# Patient Record
Sex: Male | Born: 1985 | Race: White | Hispanic: No | Marital: Single | Smoking: Never smoker
Health system: Southern US, Community
[De-identification: ages and names within clinical notes are randomized; demographics above are authoritative.]

## PROBLEM LIST (undated history)

## (undated) DIAGNOSIS — T7840XA Allergy, unspecified, initial encounter: Secondary | ICD-10-CM

## (undated) HISTORY — DX: Allergy, unspecified, initial encounter: T78.40XA

---

## 2008-10-25 ENCOUNTER — Emergency Department: Payer: Self-pay | Admitting: Emergency Medicine

## 2010-02-07 ENCOUNTER — Emergency Department: Payer: Self-pay | Admitting: Emergency Medicine

## 2010-04-11 IMAGING — CR DG CHEST 1V PORT
1 series · 1 of 1 positions shown · non-contrast
Comparison: none

REASON FOR EXAM: SHORTNESS OF BREATH
COMMENTS:

PROCEDURE:     DXR - DXR PORTABLE CHEST SINGLE VIEW  - October 25, 2008  [DATE]
RESULT:     The lungs are clear. The cardiac silhouette and visualized bony
skeleton are unremarkable.

[view not recorded]
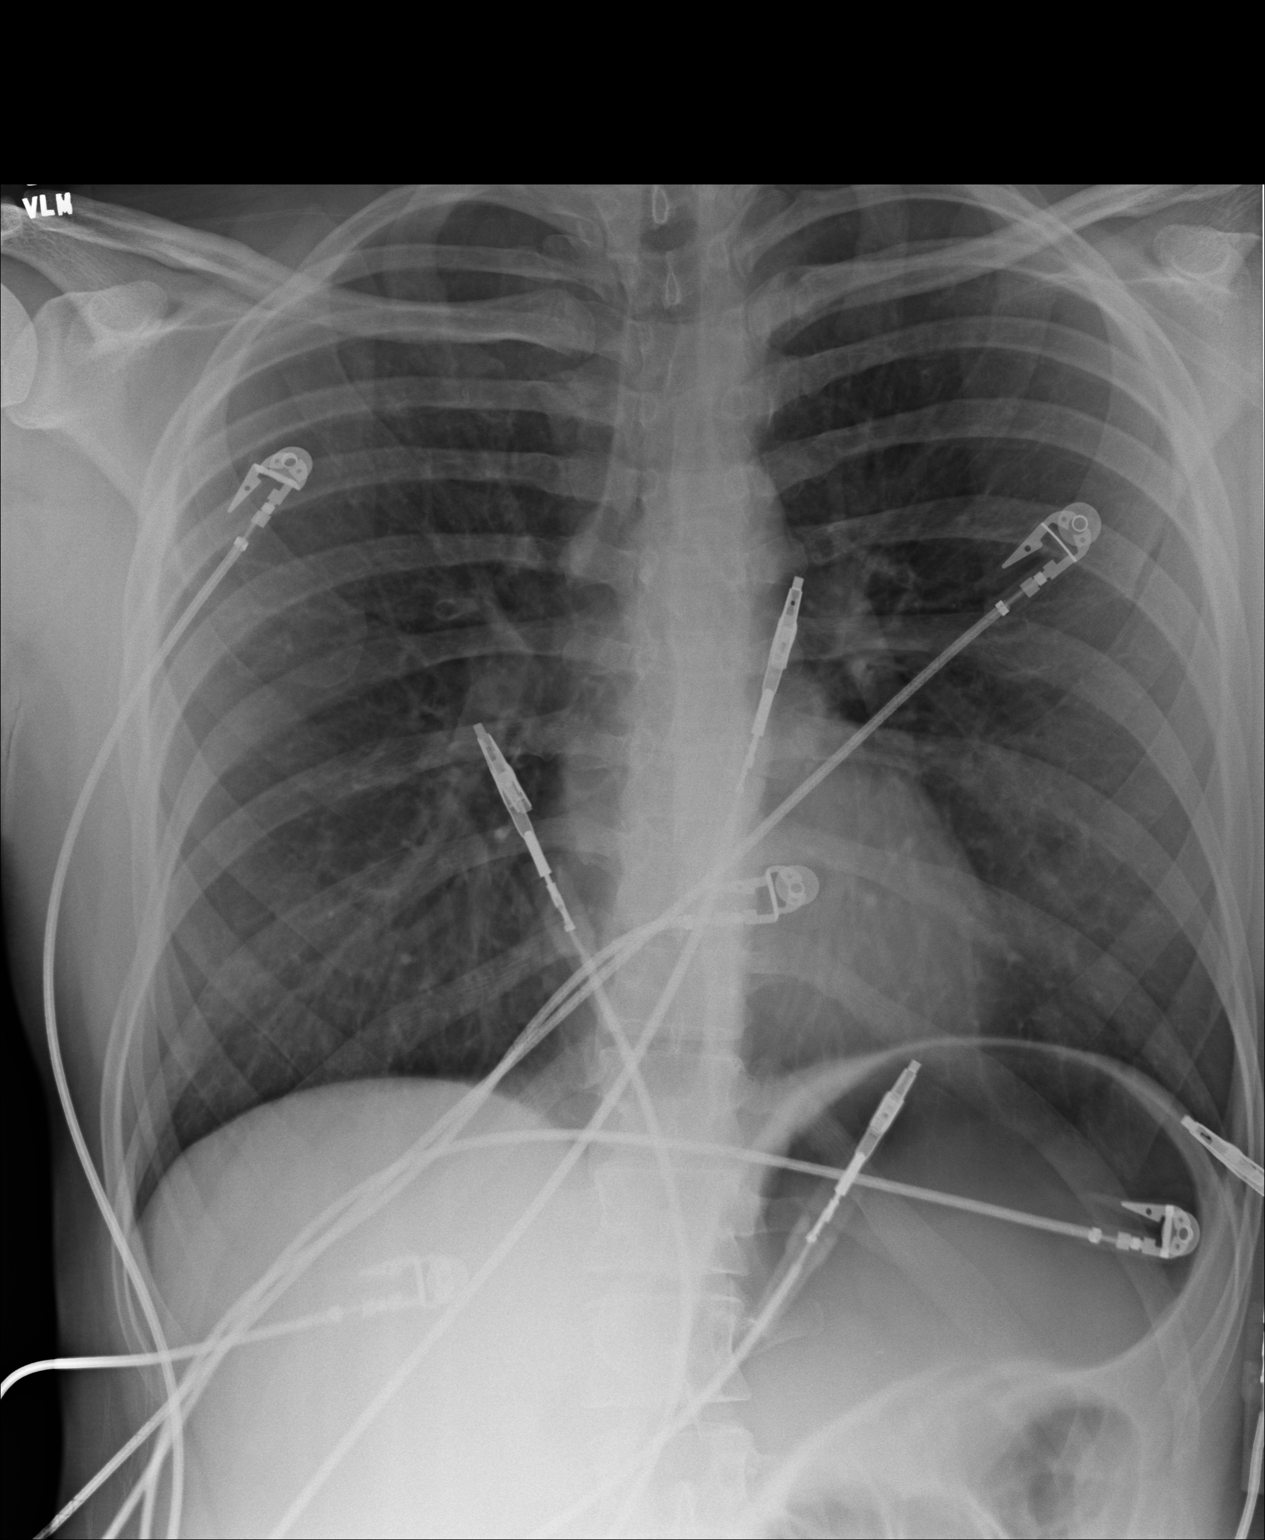

[1 of 1 positions shown; findings below may reference images not displayed]

IMPRESSION: 1. Chest radiograph without evidence of acute cardiopulmonary disease.

## 2010-08-09 ENCOUNTER — Ambulatory Visit (INDEPENDENT_AMBULATORY_CARE_PROVIDER_SITE_OTHER): Payer: BC Managed Care – PPO | Admitting: Family Medicine

## 2010-08-09 ENCOUNTER — Encounter: Payer: Self-pay | Admitting: Family Medicine

## 2010-08-09 DIAGNOSIS — J309 Allergic rhinitis, unspecified: Secondary | ICD-10-CM | POA: Insufficient documentation

## 2010-08-09 DIAGNOSIS — J45909 Unspecified asthma, uncomplicated: Secondary | ICD-10-CM

## 2010-08-12 ENCOUNTER — Encounter: Payer: Self-pay | Admitting: Family Medicine

## 2010-08-19 ENCOUNTER — Encounter: Payer: Self-pay | Admitting: Family Medicine

## 2010-08-19 ENCOUNTER — Ambulatory Visit (INDEPENDENT_AMBULATORY_CARE_PROVIDER_SITE_OTHER): Payer: BC Managed Care – PPO | Admitting: Family Medicine

## 2010-08-19 DIAGNOSIS — Z13220 Encounter for screening for lipoid disorders: Secondary | ICD-10-CM

## 2010-08-19 DIAGNOSIS — Z0000 Encounter for general adult medical examination without abnormal findings: Secondary | ICD-10-CM

## 2010-08-19 DIAGNOSIS — Z131 Encounter for screening for diabetes mellitus: Secondary | ICD-10-CM

## 2010-08-19 NOTE — Assessment & Plan Note (Signed)
Summary: NEW PT TO EST/CPX/CLE   BCBS   Vital Signs:  Patient profile:   25 year old male Height:      68 inches Weight:      145.75 pounds BMI:     22.24 Temp:     97.8 degrees F oral Pulse rate:   92 / minute Pulse rhythm:   regular BP sitting:   100 / 70  (left arm) Cuff size:   regular  Vitals Entered By: Benny Lennert CMA Duncan Dull) (August 09, 2010 1:54 PM)  History of Present Illness: Chief complaint new patient to be established   25 year old male:  call about nebs dose for bedesonide.   To ER 2011 and 2012 for asthma exacerbations. Now, better controlled on singulair and budesonide nebs with proair as needed  no symptoms now. no cough or wheezing. mostly compliant but not always.  AR, stable, now not a problem.  Preventive Screening-Counseling & Management  Alcohol-Tobacco     Smoking Status: never  Caffeine-Diet-Exercise     Does Patient Exercise: yes      Drug Use:  no.    Allergies (verified): No Known Drug Allergies  Past History:  Past medical, surgical, family and social histories (including risk factors) reviewed, and no changes noted (except as noted below).  Past Medical History: ASTHMA (ICD-493.90) Allergic rhinitis  Family History: Reviewed history and no changes required. Family History of Arthritis Family History Diabetes 1st degree relative Family History Kidney disease Family History of Prostate CA 1st degree relative <50 other blood relatives with colon,breast, and ovarian/uterus cancer Also grandparent with alzheimers  Social History: Reviewed history and no changes required. Single Never Smoked Alcohol use-yes Drug use-no Regular exercise-yes Smoking Status:  never Drug Use:  no Does Patient Exercise:  yes  Review of Systems       REVIEW OF SYSTEMS GEN: No acute illnesses, no fever, chills, sweats. CV: No chest pain or SOB GI: No noted N or V Otherwise, pertinent positives and negatives are noted in the HPI.    Physical Exam  General:  GEN: WDWN, NAD, Non-toxic, A & O x 3 HEENT: Atraumatic, Normocephalic. Neck supple. No masses, No LAD. Ears and Nose: No external deformity. CV: RRR, No M/G/R. No JVD. No thrill. No extra heart sounds. PULM: CTA B, no wheezes, crackles, rhonchi. No retractions. No resp. distress. No accessory muscle use. EXTR: No c/c/e NEURO: Normal gait.  PSYCH: Normally interactive. Conversant. Not depressed or anxious appearing.  Calm demeanor.     Impression & Recommendations:  Problem # 1:  ASTHMA (ICD-493.90) Assessment New urged compliance and refilled meds  We will obtain records from the patient's prior physicians.   His updated medication list for this problem includes:    Singulair 10 Mg Tabs (Montelukast sodium) ..... One tablet daily    Proair Hfa 108 (90 Base) Mcg/act Aers (Albuterol sulfate) .Marland Kitchen... 2 puffs every 4 hours as needed for asthma    Budesonide 0.5 Mg/51ml Susp (Budesonide) .Marland Kitchen... 1 neb inhaled daily  Problem # 2:  ALLERGIC RHINITIS (ICD-477.9) Assessment: New  Complete Medication List: 1)  Singulair 10 Mg Tabs (Montelukast sodium) .... One tablet daily 2)  Proair Hfa 108 (90 Base) Mcg/act Aers (Albuterol sulfate) .... 2 puffs every 4 hours as needed for asthma 3)  Budesonide 0.5 Mg/48ml Susp (Budesonide) .Marland Kitchen.. 1 neb inhaled daily Prescriptions: BUDESONIDE 0.5 MG/2ML SUSP (BUDESONIDE) 1 neb inhaled daily  #30 x 11   Entered and Authorized by:   Karleen Hampshire  Zeinab Rodwell MD   Signed by:   [name] MD on 08/09/2010   Method used:   Electron[facility] to        [facility] [phone]* (retail)       [address] [place], [place]  [address]       Ph: [phone] or [phone]       Fax: [phone]   RxID:   [id number] PROAIR HFA 108 (90 BASE) MCG/ACT AERS (ALBUTEROL SULFATE) 2 puffs every 4 hours as needed for asthma  #1 x 3   Entered and Authorized by:   [name] MD   Signed by:   [name] MD on  08/09/2010   Method used:   Electronically to        [facility] [phone]* (retail)       [address] [place], [place]  [address]       Ph: [phone] or [phone]       Fax: [phone]   RxID:   [id number] SINGULAIR 10 MG TABS (MONTELUKAST SODIUM) One tablet daily  #30 x 11   Entered and Authorized by:   [name] MD   Signed by:   [name] MD on 08/09/2010   Method used:   Electronically to        [facility] [phone]* (retail)       [address] [place], [place]  [address]       Ph: [phone] or [phone]       Fax: [phone]   RxID:   [id number]    Orders Added: 1)  New Patient Level III [78938]    Current Allergies (reviewed today): No known allergies

## 2010-08-19 NOTE — Progress Notes (Signed)
25 year old CPX, male:  Preventative Health Maintenance Visit:  Health Maintenance Summary Reviewed and updated, unless pt declines services.  Tobacco History Reviewed. Alcohol: No concerns, no excessive use Exercise Habits: Some activity, rec at least 30 mins 5 times a week STD concerns: no risk or activity to increase risk Drug Use: None Encouraged self-testicular check  Will check labs Asthma doing well, compliant  The PMH, PSH, Social History, Family History, Medications, and allergies have been reviewed in [facility], and have been updated if relevant.   General: Denies fever, chills, sweats. No significant weight loss. Eyes: Denies blurring,significant itching ENT: Denies earache, sore throat, and hoarseness. Cardiovascular: Denies chest pains, palpitations, dyspnea on exertion Respiratory: Denies cough, dyspnea at rest,wheeezing Breast: no concerns about lumps GI: Denies nausea, vomiting, diarrhea, constipation, change in bowel habits, abdominal pain, melena, hematochezia GU: Denies penile discharge, ED, urinary flow / outflow problems. No STD concerns. Musculoskeletal: Denies back pain, joint pain Derm: Denies rash, itching Neuro: Denies  paresthesias, frequent falls, frequent headaches Psych: Denies depression, anxiety Endocrine: Denies cold intolerance, heat intolerance, polydipsia Heme: Denies enlarged lymph nodes Allergy: No hayfever  PE: GEN: well developed, well nourished, no acute distress Eyes: conjunctiva and lids normal, PERRLA, EOMI ENT: TM clear, nares clear, oral exam WNL Neck: supple, no lymphadenopathy, no thyromegaly, no JVD Pulm: clear to auscultation and percussion, respiratory effort normal CV: regular rate and rhythm, S1-S2, no murmur, rub or gallop, no bruits, peripheral pulses normal and symmetric, no cyanosis, clubbing, edema or varicosities Chest: no scars, masses, no gynecomastia   GI: soft, non-tender; no hepatosplenomegaly, masses; active bowel  sounds all quadrants GU: no hernia, testicular mass, penile discharge Lymph: no cervical, axillary or inguinal adenopathy MSK: gait normal, muscle tone and strength WNL, no joint swelling, effusions, discoloration, crepitus  SKIN: clear, good turgor, color WNL, no rashes, lesions, or ulcerations Neuro: normal mental status, normal strength, sensation, and motion Psych: alert; oriented to person, place and time, normally interactive and not anxious or depressed in appearance.  A/P: Health Maintenance: The patient's preventative maintenance and recommended screening tests for an annual wellness exam were reviewed in full today. Brought up to date unless services declined.  Counselled on the importance of diet, exercise, and its role in overall health and mortality. The patient's FH and SH was reviewed, including their home life, tobacco status, and drug and alcohol status.  Check LDL and BMP

## 2010-08-20 LAB — BASIC METABOLIC PANEL
BUN: 10 mg/dL (ref 6–23)
CO2: 30 mEq/L (ref 19–32)
Calcium: 9.8 mg/dL (ref 8.4–10.5)
Chloride: 104 mEq/L (ref 96–112)
Creatinine, Ser: 1.1 mg/dL (ref 0.4–1.5)
GFR: 91.51 mL/min (ref >60.00)
Glucose, Bld: 87 mg/dL (ref 70–99)
Potassium: 4.6 mEq/L (ref 3.5–5.1)
Sodium: 140 mEq/L (ref 135–145)

## 2010-08-20 LAB — LDL CHOLESTEROL, DIRECT: Direct LDL: 105.2 mg/dL

## 2010-11-08 ENCOUNTER — Encounter: Payer: Self-pay | Admitting: Family Medicine

## 2010-11-08 ENCOUNTER — Ambulatory Visit (INDEPENDENT_AMBULATORY_CARE_PROVIDER_SITE_OTHER): Payer: BC Managed Care – PPO | Admitting: Family Medicine

## 2010-11-08 DIAGNOSIS — F411 Generalized anxiety disorder: Secondary | ICD-10-CM

## 2010-11-08 DIAGNOSIS — F8081 Childhood onset fluency disorder: Secondary | ICD-10-CM

## 2010-11-08 DIAGNOSIS — F419 Anxiety disorder, unspecified: Secondary | ICD-10-CM

## 2010-11-08 HISTORY — DX: Generalized anxiety disorder: F41.1

## 2010-11-08 MED ORDER — CITALOPRAM HYDROBROMIDE 20 MG PO TABS: 20.0000 mg | ORAL_TABLET | Freq: Every day | ORAL | Status: DC

## 2010-11-08 NOTE — Progress Notes (Signed)
Micheal Ford, a 25 y.o. male presents today in the office for the following:    ADHD / ADD adult questionairre:  Primary question is actually about stuttering:  >15 minutes spent in face to face time with patient, >50% spent in counselling or coordination of care: Lifelong stuttering, has been in speech when younger. Took some ADHD meds from [facility] when younger, thought it improved stuttering. After a lit search, it looks like stimulants have been associated with worsening of these symptoms. Anxiety medications can sometimes help. The patient appears mildly anxious. We will try low dose SSRI and see if therapy can help with some CBT strategies. 0 add or adhd sx on questionaire.

## 2010-11-08 NOTE — Patient Instructions (Signed)
REFERRAL: GO THE THE FRONT ROOM AT THE ENTRANCE OF OUR CLINIC, NEAR CHECK IN. ASK FOR Micheal Ford. SHE WILL HELP YOU SET UP YOUR REFERRAL. DATE: TIME:  

## 2010-12-01 ENCOUNTER — Ambulatory Visit: Payer: BC Managed Care – PPO | Admitting: Psychology

## 2011-02-04 ENCOUNTER — Other Ambulatory Visit: Payer: Self-pay | Admitting: Family Medicine

## 2011-06-20 ENCOUNTER — Other Ambulatory Visit: Payer: Self-pay | Admitting: Family Medicine

## 2011-09-07 ENCOUNTER — Other Ambulatory Visit: Payer: Self-pay | Admitting: Family Medicine

## 2011-10-05 ENCOUNTER — Other Ambulatory Visit: Payer: Self-pay | Admitting: Family Medicine

## 2011-10-30 ENCOUNTER — Other Ambulatory Visit: Payer: Self-pay | Admitting: Family Medicine

## 2011-11-25 ENCOUNTER — Other Ambulatory Visit: Payer: Self-pay | Admitting: Family Medicine

## 2011-11-29 ENCOUNTER — Emergency Department: Payer: Self-pay | Admitting: Internal Medicine

## 2011-11-30 ENCOUNTER — Ambulatory Visit (INDEPENDENT_AMBULATORY_CARE_PROVIDER_SITE_OTHER): Payer: BC Managed Care – PPO | Admitting: Family Medicine

## 2011-11-30 ENCOUNTER — Encounter: Payer: Self-pay | Admitting: Family Medicine

## 2011-11-30 ENCOUNTER — Telehealth: Payer: Self-pay | Admitting: Family Medicine

## 2011-11-30 VITALS — BP 130/82 | HR 114 | Temp 97.6°F | Ht 67.0 in | Wt 162.8 lb

## 2011-11-30 DIAGNOSIS — J45909 Unspecified asthma, uncomplicated: Secondary | ICD-10-CM

## 2011-11-30 MED ORDER — ALBUTEROL SULFATE HFA 108 (90 BASE) MCG/ACT IN AERS: 2.0000 | INHALATION_SPRAY | RESPIRATORY_TRACT | Status: DC | PRN

## 2011-11-30 MED ORDER — BUDESONIDE 0.5 MG/2ML IN SUSP: 0.5000 mg | Freq: Every day | RESPIRATORY_TRACT | Status: DC

## 2011-11-30 NOTE — Telephone Encounter (Signed)
Patient is feeling better and has appt at 11:30 today

## 2011-11-30 NOTE — Progress Notes (Signed)
   Nature conservation officer at [facility] [address] [place] [address] Phone: [phone] Fax: [phone]  Date:  11/30/2011   Name:  Micheal Ford   DOB:  1985/07/02   MRN:  [id number]  PCP:  [name], MD    Chief Complaint: Asthma and Medication Refill   History of Present Illness:  Micheal Ford is a 26 y.o. very pleasant male patient who presents with the following:  Went to the ER - no sickness, but there was a storage unit with his dad and helped his sister move out. Mask was using did not work. Dust got in throat.   The patient actually went to the emergency room yesterday. I tried to piece the history together, but I do not have any records available.  The patient also lost his proair, and seemed to be having an asthma attack yesterday. Currently he is improved  Patient Active Problem List  Diagnosis  . ALLERGIC RHINITIS  . ASTHMA  . Routine general medical examination at a health care facility  . Stuttering  . Anxiety   Past Medical History  Diagnosis Date  . Asthma   . Allergy    No past surgical history on file. History  Substance Use Topics  . Smoking status: Never Smoker   . Smokeless tobacco: Not on file  . Alcohol Use: Yes   No family history on file. No Known Allergies  Medication list has been reviewed and updated.  Current Outpatient Prescriptions on File Prior to Visit  Medication Sig Dispense Refill  . budesonide (PULMICORT) 0.5 MG/2ML nebulizer solution Take 0.5 mg by nebulization daily.        . montelukast (SINGULAIR) 10 MG tablet TAKE 1 TABLET EVERY DAY  30 tablet  8  . PROAIR HFA 108 (90 BASE) MCG/ACT inhaler USE 2 PUFFS EVERY 4 HOURS AS NEEDED FOR ASTHMA  8.5 g  0    Review of Systems:   GEN: No acute illnesses, no fevers, chills. GI: No n/v/d, eating normally Pulm: as above Interactive and getting along well at home.  Otherwise, ROS is as per the HPI.  Physical Examination: Filed Vitals:   11/30/11 1107  BP: 130/82   Pulse: 114  Temp: 97.6 F (36.4 C)   Filed Vitals:   11/30/11 1107  Height: 5\' 7"  (1.702 m)  Weight: 162 lb 12 oz (73.823 kg)   Body mass index is 25.49 kg/(m^2). Ideal Body Weight: Weight in (lb) to have BMI = 25: 159.3   Pulse 80 on my exam  GEN: WDWN, NAD, Non-toxic, A & O x 3 HEENT: Atraumatic, Normocephalic. Neck supple. No masses, No LAD. Ears and Nose: No external deformity. CV: RRR, No M/G/R. No JVD. No thrill. No extra heart sounds. PULM: CTA B, no wheezes, crackles, rhonchi. No retractions. No resp. distress. No accessory muscle use. EXTR: No c/c/e NEURO Normal gait.  PSYCH: Normally interactive. Conversant. Not depressed or anxious appearing.  Calm demeanor.    EKG / Labs / Xrays: None available at time of encounter  Assessment and Plan: 1. ASTHMA     Mild asthma exacerbation. Refill the patient's albuterol. Also gave him a refill on his inhaled budesonide which the patient does once a day.  [name], MD

## 2011-11-30 NOTE — Telephone Encounter (Signed)
Can you call and check on this patient? See how he is doing, if he went to ER?

## 2011-11-30 NOTE — Telephone Encounter (Signed)
Triage Record Num: [id number] Operator: Maryfrances Bunnell Patient Name: Micheal Ford Call Date & Time: 11/29/2011 9:18:13PM Patient Phone: [phone] PCP: [name] Patient Gender: Male PCP Fax : [phone] Patient DOB: [birth year] Practice Name[facility] Reason for Call: Caller: Ellen/Mother; PCP: [name] T.; CB#: [phone]; Call regarding Asthma; Chest tight, raspy sounding. Using nebulizer with no relief during assessment. Productive cough. Diaphoretic. Per Asthma - Adult Protocol all emergent symptoms ruled out with exception of "Having pain or aching in chest, neck, back, arm, shoulder or jaw along with asthma symptoms." 911 advised per Disposition. Care advice given. Protocol(s) Used: Asthma - Adult Recommended Outcome per Protocol: Activate EMS 911 Reason for Outcome: Having pain or aching in chest, neck, back, arm, shoulder or jaw along with asthma symptoms. Care Advice: ~ Do not give the patient anything to eat or drink. ~ An adult should stay with the patient, preferably one trained in CPR. ~ The individual's asthma action plan should be taken with patient so it can be reviewed by provider. ~ IMMEDIATE ACTION Write down provider's name. List or place the following in a bag for transport with the patient: current prescription and/or nonprescription medications; alternative treatments, therapies and medications; and street drugs. ~ ~ Place person in a position of comfort and loosen tight clothing. ~ Tell providers if you are taking an erectile dysfunction drug such as Viagra(R), Cialis(R), Levitra(R). 11/29/2011 9:33:57PM Page 1 of 1 [id number]

## 2012-02-16 ENCOUNTER — Ambulatory Visit: Payer: BC Managed Care – PPO | Admitting: Family Medicine

## 2012-03-01 ENCOUNTER — Other Ambulatory Visit: Payer: Self-pay | Admitting: Family Medicine

## 2012-03-02 NOTE — Telephone Encounter (Signed)
[facility] Request proair x 2.

## 2012-04-12 ENCOUNTER — Ambulatory Visit (INDEPENDENT_AMBULATORY_CARE_PROVIDER_SITE_OTHER): Payer: BC Managed Care – PPO | Admitting: Internal Medicine

## 2012-04-12 ENCOUNTER — Encounter: Payer: Self-pay | Admitting: Internal Medicine

## 2012-04-12 VITALS — BP 122/70 | HR 98 | Temp 98.0°F | Wt 163.0 lb

## 2012-04-12 DIAGNOSIS — J45909 Unspecified asthma, uncomplicated: Secondary | ICD-10-CM

## 2012-04-12 DIAGNOSIS — J069 Acute upper respiratory infection, unspecified: Secondary | ICD-10-CM

## 2012-04-12 MED ORDER — AMOXICILLIN 500 MG PO TABS: 1000.0000 mg | ORAL_TABLET | Freq: Two times a day (BID) | ORAL | Status: DC

## 2012-04-12 MED ORDER — ALBUTEROL SULFATE HFA 108 (90 BASE) MCG/ACT IN AERS: 2.0000 | INHALATION_SPRAY | Freq: Four times a day (QID) | RESPIRATORY_TRACT | Status: DC | PRN

## 2012-04-12 NOTE — Progress Notes (Signed)
  Subjective:    Patient ID: Ragen Macarthur, male    DOB: 06/08/1985, 26 y.o.   MRN: [id number]  HPI May have gotten sick from coworker Started with cough yesterday Sputum this AM---yellow Still some sputum as the day goes on---not sure if PND or from lungs Not really congested in nose  No headache No ear pain No fever  Feels his asthma is flared a little Still uses the budesonide bid Has used rescue inhaler at work in past 2 days--- some help  Still on singulair OTC antihistamine also No other meds for illness  Current Outpatient Prescriptions on File Prior to Visit  Medication Sig Dispense Refill  . budesonide (PULMICORT) 0.5 MG/2ML nebulizer solution Take 2 mLs (0.5 mg total) by nebulization daily.  30 mL  5  . montelukast (SINGULAIR) 10 MG tablet TAKE 1 TABLET EVERY DAY  30 tablet  8  . PROAIR HFA 108 (90 BASE) MCG/ACT inhaler INHALE 2 PUFFS INTO THE LUNGS EVERY 4 (FOUR) HOURS AS NEEDED FOR WHEEZING OR SHORTNESS OF BREATH.  8.5 each  3    No Known Allergies  Past Medical History  Diagnosis Date  . Asthma   . Allergy     No past surgical history on file.  No family history on file.  History   Social History  . Marital Status: Single    Spouse Name: N/A    Number of Children: N/A  . Years of Education: N/A   Occupational History  . Not on file.   Social History Main Topics  . Smoking status: Never Smoker   . Smokeless tobacco: Never Used  . Alcohol Use: No  . Drug Use: No  . Sexually Active: Not on file   Other Topics Concern  . Not on file   Social History Narrative  . No narrative on file   Review of Systems Doesn't remember ever having prednisone No nausea or vomiting  Appetite is fine    Objective:   Physical Exam  Constitutional: He appears well-developed and well-nourished. No distress.  HENT:  Mouth/Throat: Oropharynx is clear and moist. No oropharyngeal exudate.       No sinus tenderness TMs normal Moderate nasal inflammation  Neck:  Normal range of motion. Neck supple.  Pulmonary/Chest: Effort normal and breath sounds normal. No respiratory distress. He has no wheezes. He has no rales.  Lymphadenopathy:    He has no cervical adenopathy.          Assessment & Plan:

## 2012-04-12 NOTE — Assessment & Plan Note (Signed)
Still seems like this is just a viral infection Discussed supportive care Will give Rx for amoxil for if he worsens

## 2012-04-12 NOTE — Patient Instructions (Addendum)
Please start the amoxicillin antibiotic if you get worse in the next few days--- like with fever, increase cough or mucus production. If you are not feeling better by Thanksgiving, you should also start the antibiotic

## 2012-04-12 NOTE — Assessment & Plan Note (Signed)
Doesn't seem to be exacerbated in major way Is using rescue inhaler more but doesn't seem to need prednisone

## 2012-05-03 ENCOUNTER — Encounter: Payer: Self-pay | Admitting: Family Medicine

## 2012-05-03 ENCOUNTER — Ambulatory Visit (INDEPENDENT_AMBULATORY_CARE_PROVIDER_SITE_OTHER): Payer: BC Managed Care – PPO | Admitting: Family Medicine

## 2012-05-03 ENCOUNTER — Encounter: Payer: Self-pay | Admitting: *Deleted

## 2012-05-03 VITALS — BP 120/80 | HR 105 | Temp 98.3°F | Ht 67.0 in | Wt 164.0 lb

## 2012-05-03 DIAGNOSIS — J069 Acute upper respiratory infection, unspecified: Secondary | ICD-10-CM

## 2012-05-03 DIAGNOSIS — J309 Allergic rhinitis, unspecified: Secondary | ICD-10-CM

## 2012-05-03 DIAGNOSIS — R509 Fever, unspecified: Secondary | ICD-10-CM

## 2012-05-03 LAB — POCT INFLUENZA A/B: Influenza A, POC: NEGATIVE

## 2012-05-03 MED ORDER — MONTELUKAST SODIUM 10 MG PO TABS: 10.0000 mg | ORAL_TABLET | Freq: Every day | ORAL | Status: DC

## 2012-05-03 MED ORDER — ALBUTEROL SULFATE HFA 108 (90 BASE) MCG/ACT IN AERS: 2.0000 | INHALATION_SPRAY | Freq: Four times a day (QID) | RESPIRATORY_TRACT | Status: DC | PRN

## 2012-05-03 MED ORDER — FLUTICASONE PROPIONATE 50 MCG/ACT NA SUSP: 2.0000 | Freq: Every day | NASAL | Status: DC

## 2012-05-03 NOTE — Progress Notes (Signed)
   Nature conservation officer at [facility] [address] [place] [address] Phone: [phone] Fax: [phone]  Date:  05/03/2012   Name:  Micheal Ford   DOB:  09-05-1985   MRN:  [id number] Gender: male Age: 26 y.o.  PCP:  [name], MD  Evaluating MD: [name], MD   Chief Complaint: Asthma, Cough, Nasal Congestion, Fever and Generalized Body Aches   Patient Name: Micheal Ford Date of Birth: 10-Mar-1986 Medical Record Number: [id number]  History of Present Illness:  Patent presents with runny nose, sneezing, cough, sore throat, malaise and minimal / low-grade fever .  A lot of drainage  3 weeks, saw Dr. Alphonsus Sias, woke up this morning with 100.2 fever -- allergies  Body aches started this morning.    + recent exposure to others with similar symptoms.   The patent denies sore throat as the primary complaint. Denies sthortness of breath/wheezing, high fever, chest pain, rhinits for more than 14 days, significant myalgia, otalgia, facial pain, abdominal pain, changes in bowel or bladder.  PMH, PHS, Allergies, Problem List, Medications, Family History, and Social History have all been reviewed.  Review of Systems: as above, eating and drinking - tolerating PO. Urinating normally. No excessive vomitting or diarrhea. O/w as above.  Physical Exam:  Filed Vitals:   05/03/12 1512  BP: 120/80  Pulse: 105  Temp: 98.3 F (36.8 C)  TempSrc: Oral  Height: 5\' 7"  (1.702 m)  Weight: 164 lb (74.39 kg)  SpO2: 97%    GEN: WDWN, Non-toxic, Atraumatic, normocephalic. A and O x 3. HEENT: Oropharynx clear without exudate, MMM, no significant LAD, mild rhinnorhea Ears: TM clear, COL visualized with good landmarks CV: RRR, no m/g/r. Pulm: CTA B, no wheezes, rhonchi, or crackles, normal respiratory effort. EXT: no c/c/e Psych: well oriented, neither depressed nor anxious in appearance  A/P: 1. URI. Supportive care reviewed with patient. See patient instruction  section. Allergies, add flonase  Flu test neg  Results for orders placed in visit on 05/03/12  POCT INFLUENZA A/B      Component Value Range   Influenza A, POC Negative     Influenza B, POC

## 2012-05-26 ENCOUNTER — Other Ambulatory Visit: Payer: Self-pay | Admitting: Family Medicine

## 2012-08-24 ENCOUNTER — Other Ambulatory Visit: Payer: Self-pay | Admitting: Family Medicine

## 2012-11-01 ENCOUNTER — Encounter: Payer: Self-pay | Admitting: *Deleted

## 2012-11-01 ENCOUNTER — Encounter: Payer: Self-pay | Admitting: Family Medicine

## 2012-11-01 ENCOUNTER — Ambulatory Visit (INDEPENDENT_AMBULATORY_CARE_PROVIDER_SITE_OTHER): Payer: BC Managed Care – PPO | Admitting: Family Medicine

## 2012-11-01 VITALS — BP 120/78 | HR 94 | Temp 97.8°F | Ht 67.0 in | Wt 163.0 lb

## 2012-11-01 DIAGNOSIS — S90222A Contusion of left lesser toe(s) with damage to nail, initial encounter: Secondary | ICD-10-CM

## 2012-11-01 DIAGNOSIS — S90129A Contusion of unspecified lesser toe(s) without damage to nail, initial encounter: Secondary | ICD-10-CM

## 2012-11-01 NOTE — Progress Notes (Signed)
Nature conservation officer at [facility] [address] [place] [address] Phone: [phone] Fax: [phone]  Date:  11/01/2012   Name:  Micheal Ford   DOB:  August 02, 1985   MRN:  [id number] Gender: male Age: 27 y.o.  Primary Physician:  [name], MD  Evaluating MD: [name], MD   Chief Complaint: check place on foot   History of Present Illness:  Micheal Ford is a 27 y.o. pleasant patient who presents with the following:  l toe, darkened coloration medially, no real pain. No known trauma  Patient Active Problem List   Diagnosis Date Noted  . Stuttering 11/08/2010  . Anxiety 11/08/2010  . ALLERGIC RHINITIS 08/09/2010  . ASTHMA 08/09/2010    Past Medical History  Diagnosis Date  . Asthma   . Allergy     No past surgical history on file.  History   Social History  . Marital Status: Single    Spouse Name: N/A    Number of Children: N/A  . Years of Education: N/A   Occupational History  . Not on file.   Social History Main Topics  . Smoking status: Never Smoker   . Smokeless tobacco: Never Used  . Alcohol Use: No  . Drug Use: No  . Sexually Active: Not on file   Other Topics Concern  . Not on file   Social History Narrative  . No narrative on file    No family history on file.  No Known Allergies  Medication list has been reviewed and updated.  Outpatient Prescriptions Prior to Visit  Medication Sig Dispense Refill  . albuterol (PROAIR HFA) 108 (90 BASE) MCG/ACT inhaler Inhale 2 puffs into the lungs 4 (four) times daily as needed for wheezing.  8.5 each  1  . budesonide (PULMICORT) 0.5 MG/2ML nebulizer solution USE 1 VIAL VIA NEBULIZER EVERY DAY  60 mL  1  . fluticasone (FLONASE) 50 MCG/ACT nasal spray Place 2 sprays into the nose daily.  16 g  12  . montelukast (SINGULAIR) 10 MG tablet Take 1 tablet (10 mg total) by mouth at bedtime.  30 tablet  8  . PROVENTIL HFA 108 (90 BASE) MCG/ACT inhaler INHALE 2 PUFFS INTO THE LUNGS EVERY 6  (SIX) HOURS AS NEEDED FOR WHEEZING.  6.7 each  3   No facility-administered medications prior to visit.    Review of Systems:   GEN: No acute illnesses, no fevers, chills. GI: No n/v/d, eating normally Pulm: No SOB Interactive and getting along well at home.  Otherwise, ROS is as per the HPI.   Physical Examination: BP 120/78  Pulse 94  Temp(Src) 97.8 F (36.6 C) (Oral)  Ht 5\' 7"  (1.702 m)  Wt 163 lb (73.936 kg)  BMI 25.52 kg/m2  SpO2 97%  Ideal Body Weight: Weight in (lb) to have BMI = 25: 159.3   GEN: WDWN, NAD, Non-toxic, Alert & Oriented x 3 HEENT: Atraumatic, Normocephalic.  Ears and Nose: No external deformity. EXTR: No clubbing/cyanosis/edema, L great toe medial nail base surrounding skin darker and harder than surrounding. NEURO: Normal gait.  PSYCH: Normally interactive. Conversant. Not depressed or anxious appearing.  Calm demeanor.    Assessment and Plan:  Toenail bruise, left, initial encounter  Reassurance only.  Orders Today:  No orders of the defined types were placed in this encounter.    Updated Medication List: (Includes new medications, updates to list, dose adjustments) No orders of the defined types were placed in this encounter.  Medications Discontinued: Medications Discontinued During This Encounter  Medication Reason  . PROVENTIL HFA 108 (90 BASE) MCG/ACT inhaler Error      Signed, Shayden Bobier T. Kaytlynne Neace, MD 11/01/2012 11:45 AM

## 2013-01-28 ENCOUNTER — Encounter: Payer: Self-pay | Admitting: Family Medicine

## 2013-01-28 ENCOUNTER — Ambulatory Visit (INDEPENDENT_AMBULATORY_CARE_PROVIDER_SITE_OTHER): Payer: BC Managed Care – PPO | Admitting: Family Medicine

## 2013-01-28 VITALS — BP 124/88 | HR 80 | Temp 98.0°F | Wt 158.5 lb

## 2013-01-28 DIAGNOSIS — L708 Other acne: Secondary | ICD-10-CM

## 2013-01-28 DIAGNOSIS — L709 Acne, unspecified: Secondary | ICD-10-CM

## 2013-01-28 MED ORDER — MUPIROCIN CALCIUM 2 % EX CREA: TOPICAL_CREAM | Freq: Three times a day (TID) | CUTANEOUS | Status: DC

## 2013-01-28 NOTE — Assessment & Plan Note (Signed)
Mild, with persistent lesion on right of nose. Will treat with warm compresses and mupirocin in case developing case of impetigo. Update if not improving as expected.  Pt agrees with plan.

## 2013-01-28 NOTE — Patient Instructions (Signed)
I recommend treating with warm compresses 2-3 times daily and may use mupirocin cream two to three times a day as well. Watch for spreading redness or any worsening despite above.  If this happens, let us know.

## 2013-01-28 NOTE — Progress Notes (Signed)
  Subjective:    Patient ID: Micheal Ford, male    DOB: 04-07-1986, 27 y.o.   MRN: [id number]  HPI CC: bump on nose  5d h/o spot of acne on right side of nose - nontender.  No draining.  Wanted to make sure no infection.  No trouble on inside of nose.  Some elsewhere.   Has tried OTC facial cream which is helping.  Tried placing toothpaste on lesion as well which helped.  No fevers/chills, some redness around site.    Past Medical History  Diagnosis Date  . Asthma   . Allergy      Review of Systems Per HPI    Objective:   Physical Exam NAD, WDWN CM Few isolated papulopustular acne lesions on cheeks and forehead Larger lesion about 8mm diameter on right lateral side of nose with mild surrounding erythema and some crusting    Assessment & Plan:

## 2013-02-14 ENCOUNTER — Other Ambulatory Visit: Payer: Self-pay | Admitting: Internal Medicine

## 2013-02-14 NOTE — Telephone Encounter (Signed)
Last filled by Dr. Alphonsus Sias 03/2012, ok to fill?

## 2013-03-18 ENCOUNTER — Other Ambulatory Visit: Payer: Self-pay | Admitting: Family Medicine

## 2013-04-17 ENCOUNTER — Other Ambulatory Visit: Payer: Self-pay | Admitting: Family Medicine

## 2013-05-11 ENCOUNTER — Other Ambulatory Visit: Payer: Self-pay | Admitting: Family Medicine

## 2013-06-19 ENCOUNTER — Other Ambulatory Visit: Payer: Self-pay | Admitting: Family Medicine

## 2013-11-08 ENCOUNTER — Other Ambulatory Visit: Payer: Self-pay | Admitting: Family Medicine

## 2013-12-05 ENCOUNTER — Other Ambulatory Visit: Payer: Self-pay | Admitting: Family Medicine

## 2014-01-01 ENCOUNTER — Other Ambulatory Visit: Payer: Self-pay | Admitting: Family Medicine

## 2014-01-02 ENCOUNTER — Other Ambulatory Visit: Payer: Self-pay | Admitting: Family Medicine

## 2014-01-03 ENCOUNTER — Other Ambulatory Visit: Payer: Self-pay | Admitting: Family Medicine

## 2014-01-03 NOTE — Telephone Encounter (Signed)
Pt [facility] v/m [facility] pt already received refill but is almost out of med and has scheduled appt on 01/08/14 for med refills.  Pt request cb.

## 2014-01-06 NOTE — Telephone Encounter (Signed)
Patient notified that script has been sent to pharmacy. Was advised by patient that he has already picked it up.

## 2014-01-08 ENCOUNTER — Ambulatory Visit: Payer: BC Managed Care – PPO | Admitting: Family Medicine

## 2014-01-08 DIAGNOSIS — Z0289 Encounter for other administrative examinations: Secondary | ICD-10-CM

## 2014-02-07 ENCOUNTER — Telehealth: Payer: Self-pay

## 2014-02-07 MED ORDER — ALBUTEROL SULFATE HFA 108 (90 BASE) MCG/ACT IN AERS: INHALATION_SPRAY | RESPIRATORY_TRACT | Status: DC

## 2014-02-07 NOTE — Telephone Encounter (Signed)
[place] notified refill has been sent to his pharmacy.  Advised to make sure he keeps his appointment with Dr. Patsy Lager on Monday.

## 2014-02-07 NOTE — Telephone Encounter (Signed)
Pt left note requesting prior auth for proair; pt needs refill of proair but [facility] said PA needed. Pt request cb.

## 2014-02-07 NOTE — Telephone Encounter (Signed)
Spoke with [facility].  Patient just needs a refill not a PA.  [facility] to [facility] [address] [place].  Less has appointment to see Dr. Patsy Lager on 02/10/2014.

## 2014-02-10 ENCOUNTER — Ambulatory Visit: Payer: BC Managed Care – PPO | Admitting: Family Medicine

## 2014-02-17 ENCOUNTER — Ambulatory Visit (INDEPENDENT_AMBULATORY_CARE_PROVIDER_SITE_OTHER): Payer: BC Managed Care – PPO | Admitting: Family Medicine

## 2014-02-17 ENCOUNTER — Encounter: Payer: Self-pay | Admitting: Family Medicine

## 2014-02-17 VITALS — BP 112/70 | HR 81 | Temp 98.3°F | Ht 67.0 in | Wt 153.2 lb

## 2014-02-17 DIAGNOSIS — J309 Allergic rhinitis, unspecified: Secondary | ICD-10-CM

## 2014-02-17 DIAGNOSIS — J45909 Unspecified asthma, uncomplicated: Secondary | ICD-10-CM

## 2014-02-17 MED ORDER — FLUTICASONE PROPIONATE 50 MCG/ACT NA SUSP: NASAL | Status: DC

## 2014-02-17 MED ORDER — ALBUTEROL SULFATE HFA 108 (90 BASE) MCG/ACT IN AERS: INHALATION_SPRAY | RESPIRATORY_TRACT | Status: DC

## 2014-02-17 MED ORDER — BUDESONIDE 0.5 MG/2ML IN SUSP: RESPIRATORY_TRACT | Status: DC

## 2014-02-17 MED ORDER — MONTELUKAST SODIUM 10 MG PO TABS: ORAL_TABLET | ORAL | Status: DC

## 2014-02-17 NOTE — Progress Notes (Signed)
   Dr. Karleen Hampshire T. Nakota Ackert, MD, CAQ Sports Medicine Primary [facility] and Sports Medicine [address] [place], [address] Phone: [phone] Fax: [phone]  02/17/2014  Patient: Micheal Ford, MRN: [id number], DOB: 01-15-1986, 28 y.o.  Primary Physician:  [name], MD  Chief Complaint: Medication Refill  Subjective:   Micheal Ford is a 28 y.o. very pleasant male patient who presents with the following:  Studying culinary.   Needs refills on asthma and allergy meds  Past Medical History, Surgical History, Social History, Family History, Problem List, Medications, and Allergies have been reviewed and updated if relevant.   GEN: No acute illnesses, no fevers, chills. GI: No n/v/d, eating normally Pulm: No SOB Interactive and getting along well at home.  Otherwise, ROS is as per the HPI.  Objective:   BP 112/70  Pulse 81  Temp(Src) 98.3 F (36.8 C) (Oral)  Ht  (1.702 m)  Wt 153 lb 4 oz (69.514 kg)  BMI 24.00 kg/m2  GEN: WDWN, NAD, Non-toxic, A & O x 3 HEENT: Atraumatic, Normocephalic. Neck supple. No masses, No LAD. Ears and Nose: No external deformity. CV: RRR, No M/G/R. No JVD. No thrill. No extra heart sounds. PULM: CTA B, no wheezes, crackles, rhonchi. No retractions. No resp. distress. No accessory muscle use. EXTR: No c/c/e NEURO Normal gait.  PSYCH: Normally interactive. Conversant. Not depressed or anxious appearing.  Calm demeanor.   Laboratory and Imaging Data:  Assessment and Plan:   ASTHMA  ALLERGIC RHINITIS  Doing well otherwise  Follow-up: No Follow-up on file.  New Prescriptions   No medications on file   No orders of the defined types were placed in this encounter.    Signed,  Elpidio Galea. Vinnie Bobst, MD   Patient's Medications  New Prescriptions   No medications on file  Previous Medications   No medications on file  Modified Medications   Modified Medication Previous Medication   ALBUTEROL (PROAIR HFA) 108 (90 BASE)  MCG/ACT INHALER albuterol (PROAIR HFA) 108 (90 BASE) MCG/ACT inhaler      INHALE 2 PUFFS 4 TIMES A DAY AS NEEDED FOR WHEEZING    INHALE 2 PUFFS 4 TIMES A DAY AS NEEDED FOR WHEEZING   BUDESONIDE (PULMICORT) 0.5 MG/2ML NEBULIZER SOLUTION budesonide (PULMICORT) 0.5 MG/2ML nebulizer solution      USE 1 VIAL VIA NEBULIZER EVERY DAY    USE 1 VIAL VIA NEBULIZER EVERY DAY   FLUTICASONE (FLONASE) 50 MCG/ACT NASAL SPRAY fluticasone (FLONASE) 50 MCG/ACT nasal spray      PLACE 2 SPRAYS INTO THE NOSE DAILY.    PLACE 2 SPRAYS INTO THE NOSE DAILY.   MONTELUKAST (SINGULAIR) 10 MG TABLET montelukast (SINGULAIR) 10 MG tablet      TAKE 1 TABLET BY MOUTH AT BEDTIME    TAKE 1 TABLET BY MOUTH AT BEDTIME  Discontinued Medications   MUPIROCIN CREAM (BACTROBAN) 2 %    Apply topically 3 (three) times daily.

## 2014-02-17 NOTE — Progress Notes (Signed)
Pre visit review using our clinic review tool, if applicable. No additional management support is needed unless otherwise documented below in the visit note. 

## 2014-03-05 ENCOUNTER — Other Ambulatory Visit: Payer: Self-pay | Admitting: Family Medicine

## 2014-04-21 ENCOUNTER — Telehealth: Payer: Self-pay | Admitting: Family Medicine

## 2014-04-21 NOTE — Telephone Encounter (Signed)
Patient Assistance forms for ProAir and Budesonide completed and placed in Dr. Cyndie Chimeopland's in box for signatures.

## 2014-04-21 NOTE — Telephone Encounter (Signed)
Pt dropped off forms for rx to be filled out On donna's desk Please let pt know when forms are filled out

## 2014-04-24 NOTE — Telephone Encounter (Signed)
noted 

## 2014-04-30 NOTE — Telephone Encounter (Addendum)
Pt called to check status of forms; pt said once forms signed and then faxed to # on forms.pt request cb when forms faxed.

## 2014-05-01 NOTE — Telephone Encounter (Signed)
ProAir Patient Assistance Forms faxed to [employer] [phone] and Budesonide to [employer] [phone].  Renae Fickleaul notified patient assistance  forms have been faxed.

## 2014-05-05 NOTE — Telephone Encounter (Signed)
error 

## 2014-07-30 ENCOUNTER — Emergency Department: Payer: Self-pay | Admitting: Emergency Medicine

## 2015-02-21 ENCOUNTER — Other Ambulatory Visit: Payer: Self-pay | Admitting: Family Medicine

## 2015-02-22 NOTE — Telephone Encounter (Signed)
Last office visit 02/17/2014.  No future appointments scheduled.  Ok to refill? 

## 2015-03-13 ENCOUNTER — Other Ambulatory Visit: Payer: Self-pay | Admitting: Family Medicine

## 2015-07-09 ENCOUNTER — Ambulatory Visit (INDEPENDENT_AMBULATORY_CARE_PROVIDER_SITE_OTHER): Payer: BLUE CROSS/BLUE SHIELD | Admitting: Family Medicine

## 2015-07-09 ENCOUNTER — Encounter: Payer: Self-pay | Admitting: Family Medicine

## 2015-07-09 VITALS — BP 110/70 | HR 91 | Temp 98.0°F | Ht 67.5 in | Wt 165.2 lb

## 2015-07-09 DIAGNOSIS — J069 Acute upper respiratory infection, unspecified: Secondary | ICD-10-CM

## 2015-07-09 MED ORDER — AMOXICILLIN 500 MG PO CAPS: 1000.0000 mg | ORAL_CAPSULE | Freq: Two times a day (BID) | ORAL | Status: DC

## 2015-07-09 NOTE — Progress Notes (Signed)
Dr. Karleen Hampshire T. Barett Whidbee, MD, CAQ Sports Medicine Primary Care and Sports Medicine [address] [place], [address] Phone: [phone] Fax: [phone]  07/09/2015  Patient: REYAAN THOMA, MRN: [id number], DOB: [birth year], 30 y.o.  Primary Physician:  [name], MD   Chief Complaint  Patient presents with  . Sinusitis  . Cough   Subjective:   This 30 y.o. male patient presents with runny nose, sneezing, cough, sore throat, malaise and minimal / low-grade fever . Cough, drainage, nose and mouth, colored fluid and drainage.  Asthma is not acting up.   ? recent exposure to others with similar symptoms.   The patent denies sore throat as the primary complaint. Denies sthortness of breath/wheezing, high fever, chest pain, rhinits for more than 14 days, significant myalgia, otalgia, facial pain, abdominal pain, changes in bowel or bladder.  PMH, PHS, Allergies, Problem List, Medications, Family History, and Social History have all been reviewed.  Patient Active Problem List   Diagnosis Date Noted  . Acne 01/28/2013  . Stuttering 11/08/2010  . Anxiety 11/08/2010  . ALLERGIC RHINITIS 08/09/2010  . ASTHMA 08/09/2010    Past Medical History  Diagnosis Date  . Asthma   . Allergy     No past surgical history on file.  Social History   Social History  . Marital Status: Single    Spouse Name: N/A  . Number of Children: N/A  . Years of Education: N/A   Occupational History  . Not on file.   Social History Main Topics  . Smoking status: Never Smoker   . Smokeless tobacco: Never Used  . Alcohol Use: Yes     Comment: socially  . Drug Use: No  . Sexual Activity: Not on file   Other Topics Concern  . Not on file   Social History Narrative    No family history on file.  No Known Allergies  Medication list reviewed and updated in full in [facility] [place] [facility].  ROS as above, eating and drinking - tolerating PO. Urinating normally. No excessive vomitting or  diarrhea. O/w as above.  Objective:   Blood pressure 110/70, pulse 91, temperature 98 F (36.7 C), temperature source Oral, height 5' 7.5" (1.715 m), weight 165 lb 4 oz (74.957 kg).  GEN: WDWN, Non-toxic, Atraumatic, normocephalic. A and O x 3. HEENT: Oropharynx clear without exudate, MMM, no significant LAD, mild rhinnorhea Ears: TM clear, COL visualized with good landmarks CV: RRR, no m/g/r. Pulm: CTA B, no wheezes, rhonchi, or crackles, normal respiratory effort. EXT: no c/c/e Psych: well oriented, neither depressed nor anxious in appearance  Objective Data:  Assessment and Plan:   URI (upper respiratory infection)  Supportive care reviewed with patient. See patient instruction section.  H/o asthma - amox to hold  Follow-up: No Follow-up on file.  New Prescriptions   AMOXICILLIN (AMOXIL) 500 MG CAPSULE    Take 2 capsules (1,000 mg total) by mouth 2 (two) times daily.   Signed,  Elpidio Galea. Uriel Dowding, MD   Patient's Medications  New Prescriptions   AMOXICILLIN (AMOXIL) 500 MG CAPSULE    Take 2 capsules (1,000 mg total) by mouth 2 (two) times daily.  Previous Medications   ALBUTEROL (PROAIR HFA) 108 (90 BASE) MCG/ACT INHALER    INHALE 2 PUFFS 4 TIMES A DAY AS NEEDED FOR WHEEZING   BUDESONIDE (PULMICORT) 0.5 MG/2ML NEBULIZER SOLUTION    USE 1 VIAL VIA NEBULIZER EVERY DAY   FLUTICASONE (FLONASE) 50 MCG/ACT NASAL SPRAY  PLACE 2 SPRAYS INTO THE NOSE DAILY.   MONTELUKAST (SINGULAIR) 10 MG TABLET    TAKE 1 TABLET BY MOUTH AT BEDTIME  Modified Medications   No medications on file  Discontinued Medications   No medications on file

## 2015-07-09 NOTE — Progress Notes (Signed)
Pre visit review using our clinic review tool, if applicable. No additional management support is needed unless otherwise documented below in the visit note. 

## 2015-09-17 ENCOUNTER — Ambulatory Visit (INDEPENDENT_AMBULATORY_CARE_PROVIDER_SITE_OTHER): Payer: BLUE CROSS/BLUE SHIELD | Admitting: Family Medicine

## 2015-09-17 ENCOUNTER — Encounter: Payer: Self-pay | Admitting: Family Medicine

## 2015-09-17 VITALS — BP 126/82 | HR 76 | Temp 97.5°F | Wt 163.0 lb

## 2015-09-17 DIAGNOSIS — R350 Frequency of micturition: Secondary | ICD-10-CM

## 2015-09-17 LAB — POC URINALSYSI DIPSTICK (AUTOMATED)
Bilirubin, UA: NEGATIVE
Blood, UA: NEGATIVE
Glucose, UA: NEGATIVE
Ketones, UA: NEGATIVE
Leukocytes, UA: NEGATIVE
Nitrite, UA: NEGATIVE
Protein, UA: NEGATIVE
Spec Grav, UA: 1.015
Urobilinogen, UA: 0.2
pH, UA: 8

## 2015-09-17 NOTE — Progress Notes (Signed)
Pre visit review using our clinic review tool, if applicable. No additional management support is needed unless otherwise documented below in the visit note. 

## 2015-09-17 NOTE — Assessment & Plan Note (Signed)
UA normal today, exam normal today.  Discussed avoiding bladder irritants, avoiding holding urine, increased water intake.  Update if not resolved with above.

## 2015-09-17 NOTE — Patient Instructions (Addendum)
Urine looking ok today. Make sure you are drinking plenty of water. Avoid bladder irritants like too much caffeine, spicy foods.  This should improve with time. Let us know if persistent trouble or new symptoms arise.

## 2015-09-17 NOTE — Progress Notes (Signed)
   BP 126/82 mmHg  Pulse 76  Temp(Src) 97.5 F (36.4 C) (Oral)  Wt 163 lb (73.936 kg)   CC: urinary frequency  Subjective:    Patient ID: Micheal Ford, male    DOB: 01/02/1986, 30 y.o.   MRN: [id number]  HPI: Micheal Ford is a 30 y.o. male presenting on 09/17/2015 for Urinary Frequency   2d h/o increased urinary frequency despite same amount of fluid intake.   No fevers/chills, nausea/vomiting, urgency, dysuria, abd pain, flank pain. No hematuria. No urinary changes.  No recent abx (did have amoxicillin course 06/2015).  No new sexual partners, no STD concerns.  No h/o UTIs.   Normally voids 3x/day, has noticed up to 5-6x/day last 2 days.  Does drink coke in morning, bottle of water, sweet tea.   Relevant past medical, surgical, family and social history reviewed and updated as indicated. Interim medical history since our last visit reviewed. Allergies and medications reviewed and updated. Current Outpatient Prescriptions on File Prior to Visit  Medication Sig  . albuterol (PROAIR HFA) 108 (90 BASE) MCG/ACT inhaler INHALE 2 PUFFS 4 TIMES A DAY AS NEEDED FOR WHEEZING  . budesonide (PULMICORT) 0.5 MG/2ML nebulizer solution USE 1 VIAL VIA NEBULIZER EVERY DAY  . fluticasone (FLONASE) 50 MCG/ACT nasal spray PLACE 2 SPRAYS INTO THE NOSE DAILY.  . montelukast (SINGULAIR) 10 MG tablet TAKE 1 TABLET BY MOUTH AT BEDTIME   No current facility-administered medications on file prior to visit.    Review of Systems Per HPI unless specifically indicated in ROS section     Objective:    BP 126/82 mmHg  Pulse 76  Temp(Src) 97.5 F (36.4 C) (Oral)  Wt 163 lb (73.936 kg)  Wt Readings from Last 3 Encounters:  09/17/15 163 lb (73.936 kg)  07/09/15 165 lb 4 oz (74.957 kg)  02/17/14 153 lb 4 oz (69.514 kg)    Physical Exam  Constitutional: He appears well-developed and well-nourished. No distress.  Some stuttering  HENT:  Mouth/Throat: Oropharynx is clear and moist. No oropharyngeal  exudate.  Cardiovascular: Normal rate, regular rhythm, normal heart sounds and intact distal pulses.   No murmur heard. Pulmonary/Chest: Effort normal and breath sounds normal. No respiratory distress. He has no wheezes. He has no rales.  Abdominal: Soft. Normal appearance and bowel sounds are normal. He exhibits no distension and no mass. There is no hepatosplenomegaly. There is no tenderness. There is no rigidity, no rebound, no guarding, no CVA tenderness and negative Murphy's sign.  Musculoskeletal: He exhibits no edema.  Psychiatric: He has a normal mood and affect.  Nursing note and vitals reviewed.  Results for orders placed or performed in visit on 05/03/12  Influenza A/B  Result Value Ref Range   Influenza A, POC Negative    Influenza B, POC        Assessment & Plan:   Problem List Items Addressed This Visit    Frequent urination - Primary    UA normal today, exam normal today.  Discussed avoiding bladder irritants, avoiding holding urine, increased water intake.  Update if not resolved with above.       Relevant Orders   POCT Urinalysis Dipstick (Automated)       Follow up plan: No Follow-up on file.  Eustaquio BoydenJavier Jolena Kittle, MD

## 2015-11-28 ENCOUNTER — Other Ambulatory Visit: Payer: Self-pay | Admitting: Family Medicine

## 2016-01-14 IMAGING — CR DG CHEST 1V PORT
1 series · 1 of 1 positions shown · non-contrast
Comparison: 11/29/2011

CLINICAL DATA: Severe distress. Pale, diaphoretic, labored
breathing. Asthma attack.

EXAM:
PORTABLE CHEST - 1 VIEW

[ap]
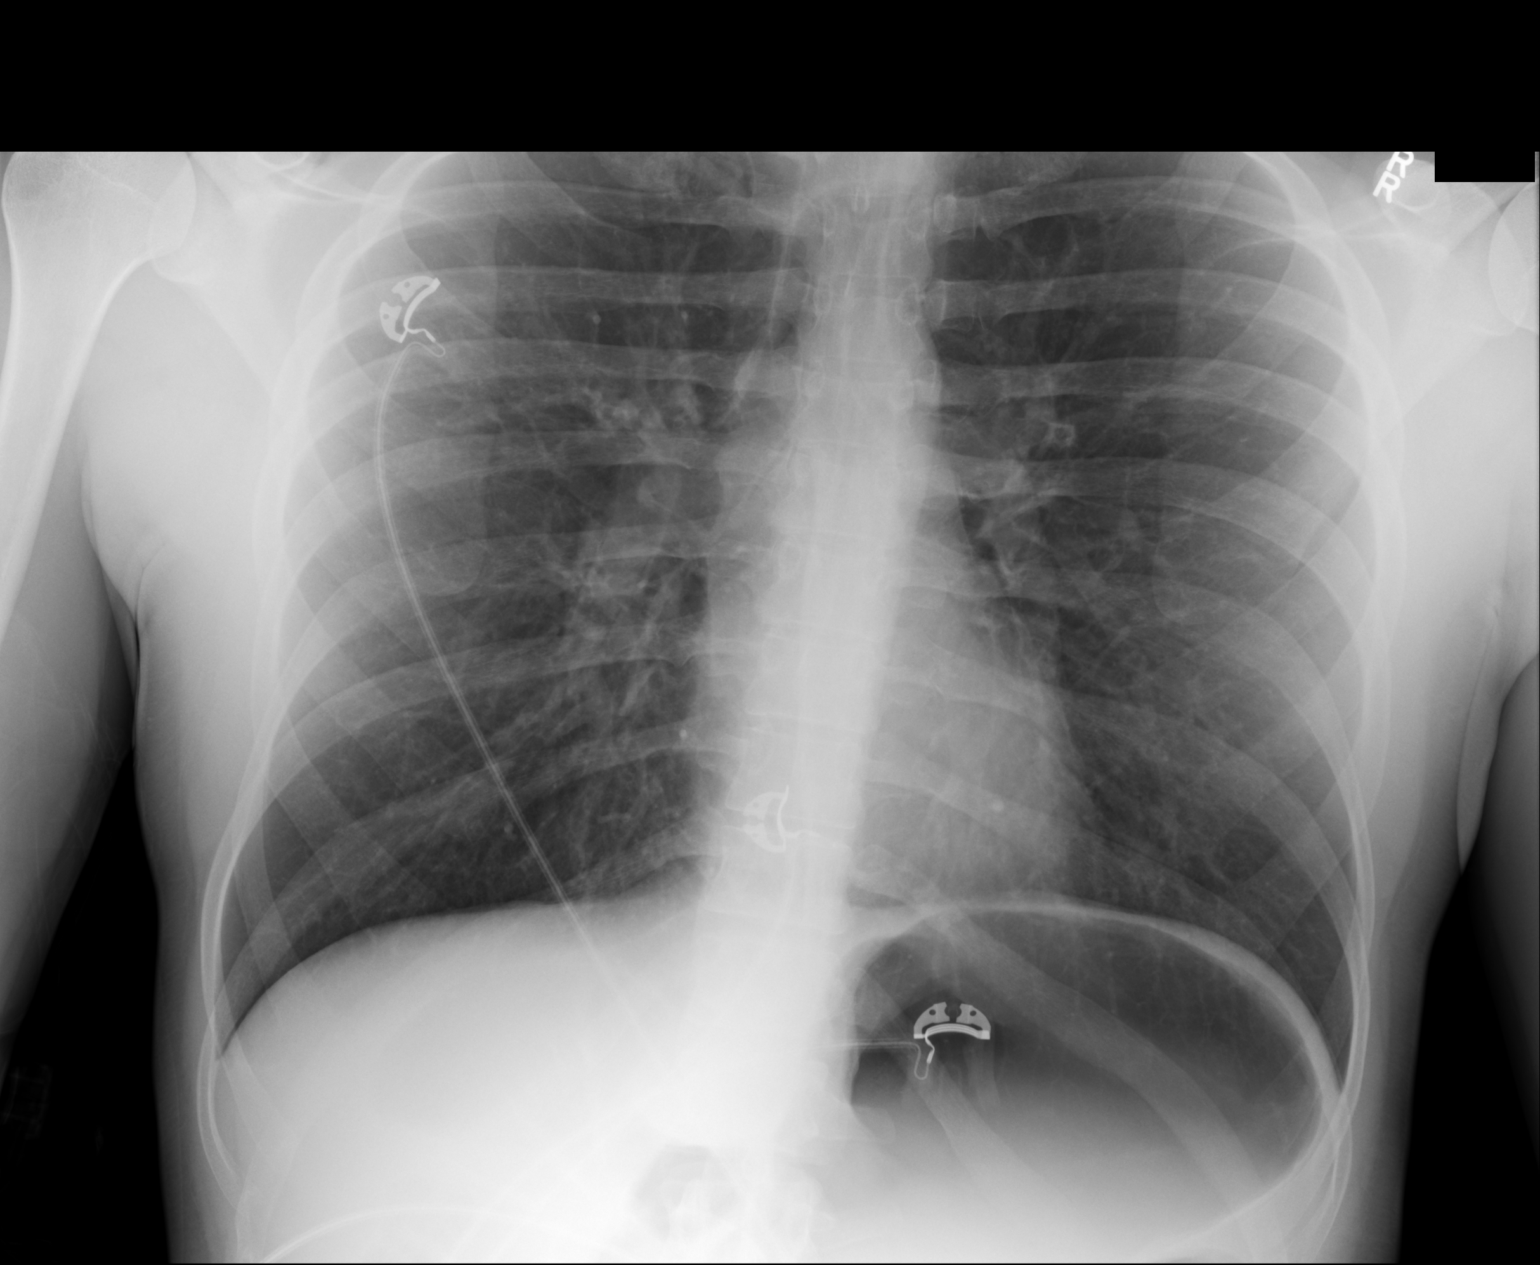

[1 of 1 positions shown; findings below may reference images not displayed]

FINDINGS: Pulmonary hyperinflation and peribronchial thickening compatible
with asthma. No focal airspace disease or consolidation in the
lungs. No blunting of costophrenic angles. No pneumothorax. Normal
heart size and pulmonary vascularity.
IMPRESSION: Hyperinflation and peribronchial thickening compatible with asthma.
No focal consolidation.

## 2016-06-30 ENCOUNTER — Encounter: Payer: Self-pay | Admitting: Primary Care

## 2016-06-30 ENCOUNTER — Ambulatory Visit (INDEPENDENT_AMBULATORY_CARE_PROVIDER_SITE_OTHER): Payer: BLUE CROSS/BLUE SHIELD | Admitting: Primary Care

## 2016-06-30 VITALS — BP 122/80 | HR 63 | Temp 97.9°F | Ht 67.0 in | Wt 174.4 lb

## 2016-06-30 DIAGNOSIS — M545 Low back pain, unspecified: Secondary | ICD-10-CM

## 2016-06-30 NOTE — Progress Notes (Signed)
   Subjective:    Patient ID: Micheal Ford, male    DOB: 09/06/1985, 31 y.o.   MRN: [id number]  HPI  Micheal Ford is a 31 year old male who presents today with a chief complaint of back pain. His pain is located to the right lower back that is intermittent. His pain began 2 days ago. He describes his pain as "nagging" pain. He does remember slipping and falling onto his back 2 days ago just prior to his pain beginning. Last night he noticed some abdominal cramping which has since resolved. He denies hematuria, dysuria, urinary frequency, fevers, vomiting, diarrhea, radiation of pain to his lower extremities. He's been taking ibuprofen for headaches and Pepto Bismol for abdominal symptoms. Overall he's feeling better.  Review of Systems  Constitutional: Negative for fever.  Gastrointestinal: Negative for abdominal pain, diarrhea, nausea and vomiting.  Genitourinary: Negative for difficulty urinating, flank pain, frequency and hematuria.  Musculoskeletal: Positive for back pain.  Neurological: Negative for numbness.       Past Medical History:  Diagnosis Date  . Allergy   . Asthma      Social History   Social History  . Marital status: Single    Spouse name: N/A  . Number of children: N/A  . Years of education: N/A   Occupational History  . Not on file.   Social History Main Topics  . Smoking status: Never Smoker  . Smokeless tobacco: Never Used  . Alcohol use Yes     Comment: socially  . Drug use: No  . Sexual activity: Not on file   Other Topics Concern  . Not on file   Social History Narrative  . No narrative on file    No past surgical history on file.  No family history on file.  No Known Allergies  Current Outpatient Prescriptions on File Prior to Visit  Medication Sig Dispense Refill  . albuterol (PROAIR HFA) 108 (90 BASE) MCG/ACT inhaler INHALE 2 PUFFS 4 TIMES A DAY AS NEEDED FOR WHEEZING 8.5 each 11  . budesonide (PULMICORT) 0.5 MG/2ML nebulizer solution  USE 1 VIAL VIA NEBULIZER EVERY DAY 60 mL 11  . fluticasone (FLONASE) 50 MCG/ACT nasal spray PLACE 2 SPRAYS INTO THE NOSE DAILY. 16 g 6  . montelukast (SINGULAIR) 10 MG tablet TAKE 1 TABLET BY MOUTH AT BEDTIME 30 tablet 5   No current facility-administered medications on file prior to visit.     BP 122/80   Pulse 63   Temp 97.9 F (36.6 C) (Oral)   Ht 5\' 7"  (1.702 m)   Wt 174 lb 6.4 oz (79.1 kg)   SpO2 99%   BMI 27.31 kg/m    Objective:   Physical Exam  Constitutional: He appears well-nourished.  Neck: Neck supple.  Cardiovascular: Normal rate and regular rhythm.   Pulmonary/Chest: Effort normal and breath sounds normal.  Abdominal: Soft. Normal appearance and bowel sounds are normal. There is no tenderness. There is no CVA tenderness.  Musculoskeletal:  Mild tenderness to right lower back upon palpation. Some pain to right lower back with forward flexion with ROM.  Skin: Skin is warm and dry.          Assessment & Plan:  Muscle Strain:  Right low back pain since slipping and falling 2 days ago. Overall feeling better. Exam consisted for MSK involvement. No urinary findings. Discussed to use Ibuprofen PRN, heat, stretching. Follow up PRN.  Morrie Sheldonlark,Mily Malecki Kendal, NP

## 2016-06-30 NOTE — Progress Notes (Signed)
Pre visit review using our clinic review tool, if applicable. No additional management support is needed unless otherwise documented below in the visit note. 

## 2016-06-30 NOTE — Patient Instructions (Signed)
Your symptoms of back pain are muscle related.  Continue Ibuprofen. You may take 400-600 mg three times daily as needed for pain.  Please call me if you start vomiting, you have increased pain, you notice diarrhea, you notice blood in your urine, you have burning when you pee.  It was nice meeting you!

## 2016-07-01 ENCOUNTER — Other Ambulatory Visit: Payer: Self-pay | Admitting: Family Medicine

## 2016-08-04 ENCOUNTER — Other Ambulatory Visit: Payer: Self-pay | Admitting: Family Medicine

## 2016-11-04 ENCOUNTER — Encounter: Payer: Self-pay | Admitting: Family Medicine

## 2016-11-04 ENCOUNTER — Ambulatory Visit (INDEPENDENT_AMBULATORY_CARE_PROVIDER_SITE_OTHER): Payer: BLUE CROSS/BLUE SHIELD | Admitting: Family Medicine

## 2016-11-04 VITALS — BP 110/80 | HR 76 | Temp 97.3°F | Ht 67.0 in | Wt 171.5 lb

## 2016-11-04 DIAGNOSIS — J301 Allergic rhinitis due to pollen: Secondary | ICD-10-CM

## 2016-11-04 NOTE — Progress Notes (Signed)
   Subjective:    Patient ID: Micheal Ford, male    DOB: 12/11/1985, 31 y.o.   MRN: [id number]  HPI    31 year old patient of Dr. Patsy Lageropland presents with new onset drainage in throat in last 2-3 days.  Raspy, sore throat. Triggers coough.  Nasal congestion  No ear pain, no sinus pain  Has noted darkening of color of mucus.   No fever. Hx asthma, not triggered... Has not had to use albuterol. No  SOB, no wheezing.   Has been using dayquil  Cough not keeping him up at night.   allergies treated with flonase and singulair.   Review of Systems  Constitutional: Negative for fatigue.  HENT: Negative for ear pain.   Eyes: Negative for pain.  Respiratory: Positive for cough. Negative for shortness of breath.   Cardiovascular: Negative for chest pain.       Objective:   Physical Exam  Constitutional: Vital signs are normal. He appears well-developed and well-nourished.  Non-toxic appearance. He does not appear ill. No distress.  HENT:  Head: Normocephalic and atraumatic.  Right Ear: Hearing, tympanic membrane, external ear and ear canal normal. No tenderness. No foreign bodies. Tympanic membrane is not retracted and not bulging.  Left Ear: Hearing, tympanic membrane, external ear and ear canal normal. No tenderness. No foreign bodies. Tympanic membrane is not retracted and not bulging.  Nose: Nose normal. No mucosal edema or rhinorrhea. Right sinus exhibits no maxillary sinus tenderness and no frontal sinus tenderness. Left sinus exhibits no maxillary sinus tenderness and no frontal sinus tenderness.  Mouth/Throat: Uvula is midline, oropharynx is clear and moist and mucous membranes are normal. Normal dentition. No dental caries. No oropharyngeal exudate or tonsillar abscesses.  Eyes: Conjunctivae, EOM and lids are normal. Pupils are equal, round, and reactive to light. Lids are everted and swept, no foreign bodies found.  Neck: Trachea normal, normal range of motion and phonation  normal. Neck supple. Carotid bruit is not present. No thyroid mass and no thyromegaly present.  Cardiovascular: Normal rate, regular rhythm, S1 normal, S2 normal, normal heart sounds, intact distal pulses and normal pulses.  Exam reveals no gallop.   No murmur heard. Pulmonary/Chest: Effort normal and breath sounds normal. No respiratory distress. He has no wheezes. He has no rhonchi. He has no rales.  Abdominal: Soft. Normal appearance and bowel sounds are normal. There is no hepatosplenomegaly. There is no tenderness. There is no rebound, no guarding and no CVA tenderness. No hernia.  Neurological: He is alert. He has normal reflexes.  Skin: Skin is warm, dry and intact. No rash noted.  Psychiatric: He has a normal mood and affect. His speech is normal and behavior is normal. Judgment normal.          Assessment & Plan:

## 2016-11-04 NOTE — Assessment & Plan Note (Signed)
Vs viral infection causing post nasal drip.  Treat with antihistmine, increase flonase and use mucinex DM.   No sign of asthma flare or bacterial infection.

## 2016-11-04 NOTE — Patient Instructions (Signed)
Increase flonase to 2 sprays per nostril daily.   Consider a trial of  Xyzal at bedtime for allergies.  Can use Mucinex DM as needed twice daily for cough and congestion.  Call if not improving as expected.. Or new fever or shortness of breath.

## 2017-04-07 ENCOUNTER — Ambulatory Visit: Payer: BLUE CROSS/BLUE SHIELD | Admitting: Family Medicine

## 2017-04-07 ENCOUNTER — Encounter: Payer: Self-pay | Admitting: Family Medicine

## 2017-04-07 VITALS — BP 126/86 | HR 70 | Temp 97.7°F | Wt 169.8 lb

## 2017-04-07 DIAGNOSIS — S9002XA Contusion of left ankle, initial encounter: Secondary | ICD-10-CM | POA: Diagnosis not present

## 2017-04-07 DIAGNOSIS — M25572 Pain in left ankle and joints of left foot: Secondary | ICD-10-CM | POA: Diagnosis not present

## 2017-04-07 DIAGNOSIS — W108XXA Fall (on) (from) other stairs and steps, initial encounter: Secondary | ICD-10-CM | POA: Diagnosis not present

## 2017-04-07 NOTE — Patient Instructions (Signed)
For ankle pain, take 2-3 over the counter ibuprofen every 8 to 12 hours as needed Can wear a slip on ankle brace to provide extra support as needed   Ankle Sprain An ankle sprain is a stretch or tear in one of the tough, fiber-like tissues (ligaments) in the ankle. The ligaments in your ankle help to hold the bones of the ankle together. What are the causes? This condition is often caused by stepping on or falling on the outer edge of the foot. What increases the risk? This condition is more likely to develop in people who play sports. What are the signs or symptoms? Symptoms of this condition include:  Pain in your ankle.  Swelling.  Bruising. Bruising may develop right after you sprain your ankle or 1-2 days later.  Trouble standing or walking, especially when you turn or change directions.  How is this diagnosed? This condition is diagnosed with a physical exam. During the exam, your health care provider will press on certain parts of your foot and ankle and try to move them in certain ways. X-rays may be taken to see how severe the sprain is and to check for broken bones. How is this treated? This condition may be treated with:  A brace. This is used to keep the ankle from moving until it heals.  An elastic bandage. This is used to support the ankle.  Crutches.  Pain medicine.  Surgery. This may be needed if the sprain is severe.  Physical therapy. This may help to improve the range of motion in the ankle.  Follow these instructions at home:  Rest your ankle.  Take over-the-counter and prescription medicines only as told by your health care provider.  For 2-3 days, keep your ankle raised (elevated) above the level of your heart as much as possible.  If directed, apply ice to the area: ? Put ice in a plastic bag. ? Place a towel between your skin and the bag. ? Leave the ice on for 20 minutes, 2-3 times a day.  If you were given a brace: ? Wear it as  directed. ? Remove it to shower or bathe. ? Try not to move your ankle much, but wiggle your toes from time to time. This helps to prevent swelling.  If you were given an elastic bandage (dressing): ? Remove it to shower or bathe. ? Try not to move your ankle much, but wiggle your toes from time to time. This helps to prevent swelling. ? Adjust the dressing to make it more comfortable if it feels too tight. ? Loosen the dressing if you have numbness or tingling in your foot, or if your foot becomes cold and blue.  If you have crutches, use them as told by your health care provider. Continue to use them until you can walk without feeling pain in your ankle. Contact a health care provider if:  You have rapidly increasing bruising or swelling.  Your pain is not relieved with medicine. Get help right away if:  Your toes or foot becomes numb or blue.  You have severe pain that gets worse. This information is not intended to replace advice given to you by your health care provider. Make sure you discuss any questions you have with your health care provider. Document Released: 05/09/2005 Document Revised: 09/16/2015 Document Reviewed: 12/09/2014 [employer] Interactive Patient Education  2017 [employer]

## 2017-04-07 NOTE — Progress Notes (Signed)
   Subjective:    Patient ID: Micheal Ford, male    DOB: 05/29/1985, 31 y.o.   MRN: [id number]  HPI This is a 31 yo male who presents today following a fall down metal stairs 3 days ago. He miss-stepped and landed on left lower back and buttock and lower leg. Continues to have some pain on left side and left ankle with movement. Has not taken any medication nor applied ice/heat.  No further falls, no ankle swelling, no bleeding, some bruising.    Past Medical History:  Diagnosis Date  . Allergy   . Asthma    No past surgical history on file. No family history on file. Social History   Tobacco Use  . Smoking status: Never Smoker  . Smokeless tobacco: Never Used  Substance Use Topics  . Alcohol use: Yes    Comment: socially  . Drug use: No      Review of Systems Per HPI    Objective:   Physical Exam  Constitutional: He is oriented to person, place, and time. He appears well-developed and well-nourished. No distress.  HENT:  Head: Normocephalic and atraumatic.  Eyes: Conjunctivae are normal.  Cardiovascular: Normal rate.  Pulmonary/Chest: Effort normal.  Musculoskeletal:       Left ankle: He exhibits normal range of motion, no swelling and no ecchymosis. Tenderness. Lateral malleolus tenderness found. No medial malleolus, no AITFL, no CF ligament, no posterior TFL, no head of 5th metatarsal and no proximal fibula tenderness found. Achilles tendon exhibits no pain and no defect.  Neurological: He is alert and oriented to person, place, and time.  Skin: Skin is warm and dry. He is not diaphoretic.  Left lateral upper leg with resolving ecchymosis.   Vitals reviewed.     BP 126/86 (BP Location: Right Arm, Patient Position: Sitting, Cuff Size: Normal)   Pulse 70   Temp 97.7 F (36.5 C) (Oral)   Wt 169 lb 12 oz (77 kg)   SpO2 98%   BMI 26.59 kg/m  Wt Readings from Last 3 Encounters:  04/07/17 169 lb 12 oz (77 kg)  11/04/16 171 lb 8 oz (77.8 kg)  06/30/16 174 lb 6.4  oz (79.1 kg)       Assessment & Plan:  1. Fall (on) (from) other stairs and steps, initial encounter -Bruising resolving without intervention  2. Acute left ankle pain -Mild ankle sprain, provided instructions for over-the-counter ibuprofen as needed - Provided written and verbal information regarding diagnosis and treatment. - RTC precautions reviewed   Olean Reeeborah Kohle Winner, FNP-BC  [facility], [facility]  04/09/2017 5:37 PM

## 2017-04-09 ENCOUNTER — Encounter: Payer: Self-pay | Admitting: Family Medicine

## 2017-12-26 ENCOUNTER — Other Ambulatory Visit: Payer: Self-pay | Admitting: Family Medicine

## 2017-12-26 NOTE — Telephone Encounter (Signed)
Copied from CRM [phone]. Topic: Quick Communication - Rx Refill/Question >> Dec 26, 2017  1:50 PM Oneal GroutSebastian, Jennifer S wrote: [facility]   Has the patient contacted their pharmacy? Yes.   (Agent: If no, request that the patient contact the pharmacy for the refill.) (Agent: If yes, when and what did the pharmacy advise?)  Preferred Pharmacy (with phone number or street name): [facility] [address]  Agent: Please be advised that RX refills may take up to 3 business days. We ask that you follow-up with your pharmacy.

## 2017-12-26 NOTE — Telephone Encounter (Signed)
Last seen by PCP 07/09/2015 for URI.  Refill?

## 2017-12-26 NOTE — Telephone Encounter (Signed)
Advair HFA 115-21 inhaler refill Last Refill:6/8/191 ordered Last OV: historical provider PCP: Dr Karleen HampshireSpencer Copland [facility] [address]

## 2017-12-27 MED ORDER — ADVAIR HFA 115-21 MCG/ACT IN AERO: 2.0000 | INHALATION_SPRAY | Freq: Two times a day (BID) | RESPIRATORY_TRACT | 1 refills | Status: DC

## 2018-02-26 ENCOUNTER — Ambulatory Visit: Payer: BLUE CROSS/BLUE SHIELD | Admitting: Family Medicine

## 2018-02-26 ENCOUNTER — Encounter: Payer: Self-pay | Admitting: Family Medicine

## 2018-02-26 VITALS — BP 100/70 | HR 72 | Temp 98.2°F | Ht 67.0 in | Wt 168.5 lb

## 2018-02-26 DIAGNOSIS — Z23 Encounter for immunization: Secondary | ICD-10-CM | POA: Diagnosis not present

## 2018-02-26 DIAGNOSIS — F411 Generalized anxiety disorder: Secondary | ICD-10-CM | POA: Diagnosis not present

## 2018-02-26 MED ORDER — ALBUTEROL SULFATE HFA 108 (90 BASE) MCG/ACT IN AERS: INHALATION_SPRAY | RESPIRATORY_TRACT | 5 refills | Status: DC

## 2018-02-26 MED ORDER — MONTELUKAST SODIUM 10 MG PO TABS: 10.0000 mg | ORAL_TABLET | Freq: Every day | ORAL | 11 refills | Status: DC

## 2018-02-26 MED ORDER — BUSPIRONE HCL 10 MG PO TABS: 10.0000 mg | ORAL_TABLET | Freq: Two times a day (BID) | ORAL | 3 refills | Status: DC

## 2018-02-26 NOTE — Patient Instructions (Signed)
Buspirone 10 mg, 1/2 tablet in the morning and 1/2 tablet in the evening for 1 week.  Then increase to 1 tablet twice a day after that.   If for some reason you funny or weird after you increase the dose, then it is ok to drop it back to a 1/2 a tablet twice a day for a few weeks, then try to increase to a whole tablet.

## 2018-02-26 NOTE — Progress Notes (Signed)
Dr. Karleen Hampshire T. Chaitanya Amedee, MD, CAQ [facility] [address] [place], [address] Phone: [phone] Fax: [phone]  02/26/2018  Patient: Micheal Ford, MRN: [id number], DOB: [birth year], 32 y.o.  Primary Physician:  [name], MD   Chief Complaint  Patient presents with  . Anxiety   Subjective:   Micheal Ford is a 32 y.o. 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 Has effected some and some at driving.  No social anxiety.  Schedule and getting behind that will bother him a lot.  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   No drugs or tobacco.  1-2 beers a week at most.  Past Medical History, Surgical History, Social History, Family History, Problem List, Medications, and Allergies have been reviewed and updated if relevant.  Patient Active Problem List   Diagnosis Date Noted  . Frequent urination 09/17/2015  . Acne 01/28/2013  . Stuttering 11/08/2010  . Anxiety 11/08/2010  . Allergic rhinitis 08/09/2010  . ASTHMA 08/09/2010    Past Medical History:  Diagnosis Date  . Allergy   . Asthma     History reviewed. No pertinent surgical history.  Social History   Socioeconomic History  . Marital status: Single    Spouse name: Not on file  . Number of children: Not on file  . Years of education: Not on file  . Highest education level: Not on file  Occupational History  . Not on file  Social Needs  . Financial resource strain: Not on file  . Food insecurity:    Worry: Not on file    Inability: Not on file  . Transportation needs:    Medical: Not on file    Non-medical: Not on file  Tobacco Use  . Smoking status: Never Smoker  . Smokeless tobacco: Never Used  Substance and Sexual Activity  . Alcohol use: Yes    Comment: socially  . Drug use: No   . Sexual activity: Not on file  Lifestyle  . Physical activity:    Days per week: Not on file    Minutes per session: Not on file  . Stress: Not on file  Relationships  . Social connections:    Talks on phone: Not on file    Gets together: Not on file    Attends religious service: Not on file    Active member of club or organization: Not on file    Attends meetings of clubs or organizations: Not on file    Relationship status: Not on file  . Intimate partner violence:    Fear of current or ex partner: Not on file    Emotionally abused: Not on file    Physically abused: Not on file    Forced sexual activity: Not on file  Other Topics Concern  . Not on file  Social History Narrative  . Not on file    History reviewed. No pertinent family history.  No Known Allergies  Medication list reviewed and updated in full in [place] [facility].   GEN: No acute illnesses, no fevers, chills. GI: No n/v/d, eating normally Pulm: No SOB Interactive and getting along well at home.  Otherwise, ROS is as per the HPI.  Objective:   BP 100/70   Pulse 72   Temp  98.2 F (36.8 C) (Oral)   Ht 5\' 7"  (1.702 m)   Wt 168 lb 8 oz (76.4 kg)   BMI 26.39 kg/m   GEN: WDWN, NAD, Non-toxic, A & O x 3 HEENT: Atraumatic, Normocephalic. Neck supple. No masses, No LAD. Ears and Nose: No external deformity. CV: RRR, No M/G/R. No JVD. No thrill. No extra heart sounds. PULM: CTA B, no wheezes, crackles, rhonchi. No retractions. No resp. distress. No accessory muscle use. EXTR: No c/c/e NEURO Normal gait.  PSYCH: Normally interactive. Conversant. Not depressed or anxious appearing.  Calm demeanor.   Laboratory and Imaging Data:  Assessment and Plan:   GAD (generalized anxiety disorder)  Need for prophylactic vaccination and inoculation against influenza - Plan: Flu Vaccine QUAD 6+ mos PF IM (Fluarix Quad PF)  He is having some anxiety that is interfering with his job performance.  A lot of this  is centered at increased tasks as he works in the same job for a longer period of time.  He is not having any social anxiety.  He denies any current depression.  I am is start him on some BuSpar and titrate up the dose.  Follow-up: Return in about 6 weeks (around 04/09/2018).  Meds ordered this encounter  Medications  . montelukast (SINGULAIR) 10 MG tablet    Sig: Take 1 tablet (10 mg total) by mouth at bedtime.    Dispense:  30 tablet    Refill:  11  . albuterol (PROAIR HFA) 108 (90 Base) MCG/ACT inhaler    Sig: INHALE 2 PUFFS 4 TIMES A DAY AS NEEDED FOR WHEEZING    Dispense:  8.5 each    Refill:  5  . busPIRone (BUSPAR) 10 MG tablet    Sig: Take 1 tablet (10 mg total) by mouth 2 (two) times daily.    Dispense:  60 tablet    Refill:  3   Orders Placed This Encounter  Procedures  . Flu Vaccine QUAD 6+ mos PF IM (Fluarix Quad PF)    Signed,  Omer Monter T. Zendaya Groseclose, MD   Allergies as of 02/26/2018   No Known Allergies     Medication List        Accurate as of 02/26/18 11:59 PM. Always use your most recent med list.          ADVAIR HFA 115-21 MCG/ACT inhaler Generic drug:  fluticasone-salmeterol Inhale 2 puffs into the lungs 2 (two) times daily.   albuterol 108 (90 Base) MCG/ACT inhaler Commonly known as:  PROVENTIL HFA;VENTOLIN HFA INHALE 2 PUFFS 4 TIMES A DAY AS NEEDED FOR WHEEZING   budesonide 0.5 MG/2ML nebulizer solution Commonly known as:  PULMICORT USE 1 VIAL VIA NEBULIZER EVERY DAY   busPIRone 10 MG tablet Commonly known as:  BUSPAR Take 1 tablet (10 mg total) by mouth 2 (two) times daily.   fluticasone 50 MCG/ACT nasal spray Commonly known as:  FLONASE PLACE 2 SPRAYS INTO THE NOSE DAILY.   montelukast 10 MG tablet Commonly known as:  SINGULAIR Take 1 tablet (10 mg total) by mouth at bedtime.

## 2018-03-19 ENCOUNTER — Telehealth: Payer: Self-pay | Admitting: Family Medicine

## 2018-03-19 DIAGNOSIS — F419 Anxiety disorder, unspecified: Secondary | ICD-10-CM

## 2018-03-19 NOTE — Telephone Encounter (Signed)
Copied from CRM [id number]. Topic: Quick Communication - See Telephone Encounter >> Mar 19, 2018  3:47 PM Micheal Ford, Rosey Bath D wrote: CRM for notification. See Telephone encounter for: 03/19/18. Pt called and would like to talk to someone about his anxiety and coping mechanism. Please call pt back, thanks.

## 2018-03-19 NOTE — Telephone Encounter (Signed)
I spoke with pt; pt sister speaks with counselor and suggested to pt to see if he could speak with a counselor just to talk with someone. Pt said is taking the buspirone 10 mg one tab bid and that is running smoothly right now but thought take added benefit of speaking with a counselor. No SI/HI. Pt will wait for cb. Pt last seen 02/26/18.

## 2018-03-20 NOTE — Telephone Encounter (Signed)
Order placed

## 2018-03-20 NOTE — Telephone Encounter (Signed)
Called the patient and gave him the info for [facility] for Synetta Fail and Camila Li. He will all them directly to schedule an Appt with whoever can see him the soonest.

## 2018-03-20 NOTE — Telephone Encounter (Signed)
Yes please

## 2018-03-20 NOTE — Telephone Encounter (Signed)
Do I need to put in a referral for a counselor?

## 2018-03-21 ENCOUNTER — Other Ambulatory Visit: Payer: Self-pay | Admitting: Family Medicine

## 2018-04-09 ENCOUNTER — Ambulatory Visit: Payer: BLUE CROSS/BLUE SHIELD | Admitting: Psychology

## 2018-04-09 ENCOUNTER — Ambulatory Visit: Payer: BLUE CROSS/BLUE SHIELD | Admitting: Family Medicine

## 2018-04-11 ENCOUNTER — Ambulatory Visit: Payer: BLUE CROSS/BLUE SHIELD | Admitting: Family Medicine

## 2018-04-17 ENCOUNTER — Encounter: Payer: Self-pay | Admitting: Family Medicine

## 2018-04-17 ENCOUNTER — Ambulatory Visit: Payer: BLUE CROSS/BLUE SHIELD | Admitting: Family Medicine

## 2018-04-17 VITALS — BP 100/72 | HR 53 | Temp 97.5°F | Ht 67.0 in | Wt 168.2 lb

## 2018-04-17 DIAGNOSIS — F411 Generalized anxiety disorder: Secondary | ICD-10-CM | POA: Diagnosis not present

## 2018-04-17 NOTE — Patient Instructions (Signed)
Continue current medication.

## 2018-04-17 NOTE — Assessment & Plan Note (Signed)
Excellent control on low dose buspar. No need for med change.  Agree with counsleing.

## 2018-04-17 NOTE — Progress Notes (Signed)
   Subjective:    Patient ID: Micheal Ford, male    DOB: 08/17/1985, 32 y.o.   MRN: [id number]  HPI    32 year old male pt of Dr Copland's presents for follow up GAD.  GAD: He is on Buspar 10 mg BID... Started on thin 2 months ago.  Initially he had some SE, but they resolved. He feels it has helped him focus and feel less stressed out overall.  Work performance has improved in higher stress situations.  He feels he is at baseline on the current dose of the medication. No trouble sleeping.  NO SI.     He is also schedule to see counselor in Dec with Elisha PonderAnita Pardo.  Today: GAD7 : 0  PHQ9: 0    Review of Systems  Constitutional: Negative for fatigue and fever.  HENT: Negative for ear pain.   Eyes: Negative for pain.  Respiratory: Negative for cough and shortness of breath.   Cardiovascular: Negative for chest pain, palpitations and leg swelling.  Gastrointestinal: Negative for abdominal pain.  Genitourinary: Negative for dysuria.  Musculoskeletal: Negative for arthralgias.  Neurological: Negative for syncope, light-headedness and headaches.  Psychiatric/Behavioral: Negative for dysphoric mood.       Objective:   Physical Exam  Constitutional: Vital signs are normal. He appears well-developed and well-nourished.  HENT:  Head: Normocephalic.  Right Ear: Hearing normal.  Left Ear: Hearing normal.  Nose: Nose normal.  Mouth/Throat: Oropharynx is clear and moist and mucous membranes are normal.  Neck: Trachea normal. Carotid bruit is not present. No thyroid mass and no thyromegaly present.  Cardiovascular: Normal rate, regular rhythm and normal pulses. Exam reveals no gallop, no distant heart sounds and no friction rub.  No murmur heard. No peripheral edema  Pulmonary/Chest: Effort normal and breath sounds normal. No respiratory distress.  Skin: Skin is warm, dry and intact. No rash noted.  Psychiatric: He has a normal mood and affect. His speech is normal and behavior is  normal. Thought content normal.          Assessment & Plan:

## 2018-04-24 ENCOUNTER — Ambulatory Visit: Payer: BLUE CROSS/BLUE SHIELD | Admitting: Psychology

## 2018-04-24 DIAGNOSIS — F410 Panic disorder [episodic paroxysmal anxiety] without agoraphobia: Secondary | ICD-10-CM

## 2018-05-29 ENCOUNTER — Ambulatory Visit (INDEPENDENT_AMBULATORY_CARE_PROVIDER_SITE_OTHER): Payer: PRIVATE HEALTH INSURANCE | Admitting: Psychology

## 2018-05-29 DIAGNOSIS — F410 Panic disorder [episodic paroxysmal anxiety] without agoraphobia: Secondary | ICD-10-CM | POA: Diagnosis not present

## 2018-06-12 ENCOUNTER — Ambulatory Visit (INDEPENDENT_AMBULATORY_CARE_PROVIDER_SITE_OTHER): Payer: PRIVATE HEALTH INSURANCE | Admitting: Psychology

## 2018-06-12 DIAGNOSIS — F410 Panic disorder [episodic paroxysmal anxiety] without agoraphobia: Secondary | ICD-10-CM

## 2018-06-26 ENCOUNTER — Ambulatory Visit (INDEPENDENT_AMBULATORY_CARE_PROVIDER_SITE_OTHER): Payer: PRIVATE HEALTH INSURANCE | Admitting: Psychology

## 2018-06-26 DIAGNOSIS — F410 Panic disorder [episodic paroxysmal anxiety] without agoraphobia: Secondary | ICD-10-CM

## 2018-07-10 ENCOUNTER — Ambulatory Visit: Payer: PRIVATE HEALTH INSURANCE | Admitting: Psychology

## 2018-07-15 NOTE — Progress Notes (Signed)
Error - cancelled appt This encounter was created in error - please disregard.

## 2018-07-16 ENCOUNTER — Other Ambulatory Visit: Payer: Self-pay | Admitting: Family Medicine

## 2018-07-16 DIAGNOSIS — Z13220 Encounter for screening for lipoid disorders: Secondary | ICD-10-CM

## 2018-07-16 DIAGNOSIS — Z131 Encounter for screening for diabetes mellitus: Secondary | ICD-10-CM

## 2018-07-16 DIAGNOSIS — Z113 Encounter for screening for infections with a predominantly sexual mode of transmission: Secondary | ICD-10-CM

## 2018-07-16 DIAGNOSIS — Z0000 Encounter for general adult medical examination without abnormal findings: Secondary | ICD-10-CM

## 2018-07-18 ENCOUNTER — Encounter: Payer: Self-pay | Admitting: Family Medicine

## 2018-07-24 ENCOUNTER — Ambulatory Visit (INDEPENDENT_AMBULATORY_CARE_PROVIDER_SITE_OTHER): Payer: PRIVATE HEALTH INSURANCE | Admitting: Psychology

## 2018-07-24 DIAGNOSIS — F410 Panic disorder [episodic paroxysmal anxiety] without agoraphobia: Secondary | ICD-10-CM | POA: Diagnosis not present

## 2018-08-07 ENCOUNTER — Ambulatory Visit (INDEPENDENT_AMBULATORY_CARE_PROVIDER_SITE_OTHER): Payer: PRIVATE HEALTH INSURANCE | Admitting: Psychology

## 2018-08-07 ENCOUNTER — Other Ambulatory Visit: Payer: Self-pay

## 2018-08-07 DIAGNOSIS — F410 Panic disorder [episodic paroxysmal anxiety] without agoraphobia: Secondary | ICD-10-CM

## 2018-08-21 ENCOUNTER — Other Ambulatory Visit: Payer: Self-pay

## 2018-08-21 ENCOUNTER — Ambulatory Visit: Payer: PRIVATE HEALTH INSURANCE | Admitting: Psychology

## 2018-08-22 ENCOUNTER — Ambulatory Visit (INDEPENDENT_AMBULATORY_CARE_PROVIDER_SITE_OTHER): Payer: PRIVATE HEALTH INSURANCE | Admitting: Psychology

## 2018-08-22 DIAGNOSIS — F410 Panic disorder [episodic paroxysmal anxiety] without agoraphobia: Secondary | ICD-10-CM

## 2018-09-05 ENCOUNTER — Ambulatory Visit (INDEPENDENT_AMBULATORY_CARE_PROVIDER_SITE_OTHER): Payer: PRIVATE HEALTH INSURANCE | Admitting: Psychology

## 2018-09-05 DIAGNOSIS — F410 Panic disorder [episodic paroxysmal anxiety] without agoraphobia: Secondary | ICD-10-CM

## 2018-09-18 ENCOUNTER — Ambulatory Visit (INDEPENDENT_AMBULATORY_CARE_PROVIDER_SITE_OTHER): Payer: PRIVATE HEALTH INSURANCE | Admitting: Psychology

## 2018-09-18 DIAGNOSIS — F410 Panic disorder [episodic paroxysmal anxiety] without agoraphobia: Secondary | ICD-10-CM

## 2018-09-25 ENCOUNTER — Ambulatory Visit (INDEPENDENT_AMBULATORY_CARE_PROVIDER_SITE_OTHER): Payer: PRIVATE HEALTH INSURANCE | Admitting: Allergy & Immunology

## 2018-09-25 ENCOUNTER — Other Ambulatory Visit: Payer: Self-pay

## 2018-09-25 ENCOUNTER — Encounter: Payer: Self-pay | Admitting: Allergy & Immunology

## 2018-09-25 VITALS — BP 120/84 | HR 86 | Temp 98.2°F | Resp 16 | Ht 69.0 in | Wt 178.6 lb

## 2018-09-25 DIAGNOSIS — J3089 Other allergic rhinitis: Secondary | ICD-10-CM

## 2018-09-25 DIAGNOSIS — J4540 Moderate persistent asthma, uncomplicated: Secondary | ICD-10-CM

## 2018-09-25 DIAGNOSIS — J302 Other seasonal allergic rhinitis: Secondary | ICD-10-CM | POA: Diagnosis not present

## 2018-09-25 HISTORY — DX: Other seasonal allergic rhinitis: J30.2

## 2018-09-25 HISTORY — DX: Moderate persistent asthma, uncomplicated: J45.40

## 2018-09-25 HISTORY — DX: Other seasonal allergic rhinitis: J30.89

## 2018-09-25 NOTE — Progress Notes (Signed)
VIALS EXP 09-26-2019 

## 2018-09-25 NOTE — Patient Instructions (Addendum)
1. Moderate persistent asthma, uncomplicated - We did not do spirometry (lung testing) since this has been shown to spread coronavirus.  - We are going to continue you with the same medications. - Spacer sample and demonstration provided. - Daily controller medication(s): Singulair 10mg  daily and Advair 115/6mcg two puffs twice daily with spacer - Prior to physical activity: albuterol 2 puffs 10-15 minutes before physical activity. - Rescue medications: albuterol 4 puffs every 4-6 hours as needed - Asthma control goals:  * Full participation in all desired activities (may need albuterol before activity) * Albuterol use two time or less a week on average (not counting use with activity) * Cough interfering with sleep two time or less a month * Oral steroids no more than once a year * No hospitalizations  2. Chronic rhinitis - Testing today showed: grasses, ragweed, weeds, trees, indoor molds, outdoor molds, dust mites, cat, dog and cockroach - Copy of test results provided.  - Avoidance measures provided. - Continue with: Singulair (montelukast) 10mg  daily and Flonase (fluticasone) two sprays per nostril daily - Start taking: Xyzal (levocetirizine) 5mg  tablet once daily as needed on the worst days.  - You can use an extra dose of the antihistamine, if needed, for breakthrough symptoms.  - Consider nasal saline rinses 1-2 times daily to remove allergens from the nasal cavities as well as help with mucous clearance (this is especially helpful to do before the nasal sprays are given) - Make an appointment in two weeks for the first injection.   3. Return in about 3 months (around 12/26/2018). This can be an in-person, a virtual Webex or a telephone follow up visit. Make an appointment in two weeks to start your allergy injections.    Please inform us of any Emergency Department visits, hospitalizations, or changes in symptoms. Call us before going to the ED for breathing or allergy symptoms  since we might be able to fit you in for a sick visit. Feel free to contact us anytime with any questions, problems, or concerns.  It was a pleasure to meet you today!  Websites that have reliable patient information: 1. American Academy of Asthma, Allergy, and Immunology: www.aaaai.org 2. Food Allergy Research and Education (FARE): foodallergy.org 3. Mothers of Asthmatics: http://www.asthmacommunitynetwork.org 4. American College of Allergy, Asthma, and Immunology: www.acaai.org  "Like" [place] on Facebook and Instagram for our latest updates!      Make sure you are registered to vote! If you have moved or changed any of your contact information, you will need to get this updated before voting!    Voter ID laws are NOT going into effect for the General Election in November 2020! DO NOT let this stop you from exercising your right to vote!    Reducing Pollen Exposure  The American Academy of Allergy, Asthma and Immunology suggests the following steps to reduce your exposure to pollen during allergy seasons.    1. Do not hang sheets or clothing out to dry; pollen may collect on these items. 2. Do not mow lawns or spend time around freshly cut grass; mowing stirs up pollen. 3. Keep windows closed at night.  Keep car windows closed while driving. 4. Minimize morning activities outdoors, a time when pollen counts are usually at their highest. 5. Stay indoors as much as possible when pollen counts or humidity is high and on windy days when pollen tends to remain in the air longer. 6. Use air conditioning when possible.  Many air conditioners have filters that  trap the pollen spores. 7. Use a HEPA room air filter to remove pollen form the indoor air you breathe.  Control of Mold Allergen   Mold and fungi can grow on a variety of surfaces provided certain temperature and moisture conditions exist.  Outdoor molds grow on plants, decaying vegetation and soil.  The major outdoor mold, Alternaria  and Cladosporium, are found in very high numbers during hot and dry conditions.  Generally, a late Summer - Fall peak is seen for common outdoor fungal spores.  Rain will temporarily lower outdoor mold spore count, but counts rise rapidly when the rainy period ends.  The most important indoor molds are Aspergillus and Penicillium.  Dark, humid and poorly ventilated basements are ideal sites for mold growth.  The next most common sites of mold growth are the bathroom and the kitchen.  Outdoor (Seasonal) Mold Control   1. Use air conditioning and keep windows closed 2. Avoid exposure to decaying vegetation. 3. Avoid leaf raking. 4. Avoid grain handling. 5. Consider wearing a face mask if working in moldy areas.    Indoor (Perennial) Mold Control    1. Maintain humidity below 50%. 2. Clean washable surfaces with 5% bleach solution. 3. Remove sources e.g. contaminated carpets.     Control of House Dust Mite Allergen    House dust mites play a major role in allergic asthma and rhinitis.  They occur in environments with high humidity wherever human skin, the food for dust mites is found. High levels have been detected in dust obtained from mattresses, pillows, carpets, upholstered furniture, bed covers, clothes and soft toys.  The principal allergen of the house dust mite is found in its feces.  A gram of dust may contain 1,000 mites and 250,000 fecal particles.  Mite antigen is easily measured in the air during house cleaning activities.    1. Encase mattresses, including the box spring, and pillow, in an air tight cover.  Seal the zipper end of the encased mattresses with wide adhesive tape. 2. Wash the bedding in water of 130 degrees Farenheit weekly.  Avoid cotton comforters/quilts and flannel bedding: the most ideal bed covering is the dacron comforter. 3. Remove all upholstered furniture from the bedroom. 4. Remove carpets, carpet padding, rugs, and non-washable window drapes from the  bedroom.  Wash drapes weekly or use plastic window coverings. 5. Remove all non-washable stuffed toys from the bedroom.  Wash stuffed toys weekly. 6. Have the room cleaned frequently with a vacuum cleaner and a damp dust-mop.  The patient should not be in a room which is being cleaned and should wait 1 hour after cleaning before going into the room. 7. Close and seal all heating outlets in the bedroom.  Otherwise, the room will become filled with dust-laden air.  An electric heater can be used to heat the room. 8. Reduce indoor humidity to less than 50%.  Do not use a humidifier.  Control of Dog or Cat Allergen  Avoidance is the best way to manage a dog or cat allergy. If you have a dog or cat and are allergic to dog or cats, consider removing the dog or cat from the home. If you have a dog or cat but don't want to find it a new home, or if your family wants a pet even though someone in the household is allergic, here are some strategies that may help keep symptoms at bay:  1. Keep the pet out of your bedroom and restrict it  to only a few rooms. Be advised that keeping the dog or cat in only one room will not limit the allergens to that room. 2. Don't pet, hug or kiss the dog or cat; if you do, wash your hands with soap and water. 3. High-efficiency particulate air (HEPA) cleaners run continuously in a bedroom or living room can reduce allergen levels over time. 4. Regular use of a high-efficiency vacuum cleaner or a central vacuum can reduce allergen levels. 5. Giving your dog or cat a bath at least once a week can reduce airborne allergen.  Control of Cockroach Allergen  Cockroach allergen has been identified as an important cause of acute attacks of asthma, especially in urban settings.  There are fifty-five species of cockroach that exist in the [place], however only three, the [place], [place] and Oriental species produce allergen that can affect patients with Asthma.  Allergens can be  obtained from fecal particles, egg casings and secretions from cockroaches.    1. Remove food sources. 2. Reduce access to water. 3. Seal access and entry points. 4. Spray runways with 0.5-1% Diazinon or Chlorpyrifos 5. Blow boric acid power under stoves and refrigerator. 6. Place bait stations (hydramethylnon) at feeding sites.

## 2018-09-25 NOTE — Progress Notes (Signed)
NEW PATIENT  Date of Service/Encounter:  09/25/18  Referring provider: Hannah Beatopland, [place], MD   Assessment:   Moderate persistent asthma, uncomplicated  Seasonal and perennial allergic rhinitis (grasses, ragweed, weeds, trees, indoor molds, outdoor molds, dust mites, cat, dog and cockroach)  Plan/Recommendations:    1. Moderate persistent asthma, uncomplicated - We did not do spirometry (lung testing) since this has been shown to spread coronavirus.  - We are going to continue you with the same medications. - Spacer sample and demonstration provided. - Daily controller medication(s): Singulair 10mg  daily and Advair 115/6621mcg two puffs twice daily with spacer - Prior to physical activity: albuterol 2 puffs 10-15 minutes before physical activity. - Rescue medications: albuterol 4 puffs every 4-6 hours as needed - Asthma control goals:  * Full participation in all desired activities (may need albuterol before activity) * Albuterol use two time or less a week on average (not counting use with activity) * Cough interfering with sleep two time or less a month * Oral steroids no more than once a year * No hospitalizations  2. Chronic rhinitis - Testing today showed: grasses, ragweed, weeds, trees, indoor molds, outdoor molds, dust mites, cat, dog and cockroach - Copy of test results provided.  - Avoidance measures provided. - Continue with: Singulair (montelukast) 10mg  daily and Flonase (fluticasone) two sprays per nostril daily - Start taking: Xyzal (levocetirizine) 5mg  tablet once daily as needed on the worst days.  - You can use an extra dose of the antihistamine, if needed, for breakthrough symptoms.  - Consider nasal saline rinses 1-2 times daily to remove allergens from the nasal cavities as well as help with mucous clearance (this is especially helpful to do before the nasal sprays are given) - Make an appointment in two weeks for the first injection.   3. Return in about 3  months (around 12/26/2018). This can be an in-person, a virtual Webex or a telephone follow up visit. Make an appointment in two weeks to start your allergy injections.    Subjective:   Micheal Ford is a 33 y.o. male presenting today for evaluation of  Chief Complaint  Patient presents with  . Immunotherapy    [facility] [place] [place]     Micheal Ford has a history of the following: Patient Active Problem List   Diagnosis Date Noted  . Seasonal and perennial allergic rhinitis 09/25/2018  . Moderate persistent asthma, uncomplicated 09/25/2018  . Acne 01/28/2013  . Stuttering 11/08/2010  . GAD (generalized anxiety disorder) 11/08/2010  . Allergic rhinitis 08/09/2010  . ASTHMA 08/09/2010    History obtained from: chart review and patient.  Micheal Ford was referred by Hannah Beatopland, [place], MD.     Micheal Ford is a 33 y.o. male presenting for an evaluation of allergic rhinitis.  He actually was seen at a [place] practice [place].  He had testing done in 2017 or 2018.  He is unsure what he was positive to, but tells me he was definitely positive to smoke.  We do not have his testing in front of us today.  Regardless, he has been on shots for around 2 years.  He is unsure where he was in the buildup.  He is on montelukast 10 mg daily and fluticasone 1 to 2 sprays per nostril daily.  Never been on an antihistamine to his knowledge.  He is interested in continuing with shots, but he does not have his vials here.  Apparently he changed insurances from  [employer] to [employer], which is not covered at his previous allergy office.  He was continuing to get shots and paying out-of-pocket, but once his vials were used up cost of paying for these out-of-pocket was prohibitive.  He does have a history of asthma.  He is on Advair 115/21 mcg 2 puffs twice daily.  He does not use a spacer.  He has albuterol to use on hand as needed.  Overall his symptoms have improved since starting the  Advair and since starting allergy shots.  He has not needed prednisone in well over 3 years.  He was hospitalized in 2011, 2013, 2016.  He has not been to the hospital or the ED R since that time.  He has never been intubated in his life.  He has no history of food allergies.  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   Otherwise, there is no history of other atopic diseases, including food allergies, drug allergies, stinging insect allergies, eczema, urticaria or contact dermatitis. There is no significant infectious history. Vaccinations are up to date.    Past Medical History: Patient Active Problem List   Diagnosis Date Noted  . Seasonal and perennial allergic rhinitis 09/25/2018  . Moderate persistent asthma, uncomplicated 09/25/2018  . Acne 01/28/2013  . Stuttering 11/08/2010  . GAD (generalized anxiety disorder) 11/08/2010  . Allergic rhinitis 08/09/2010  . ASTHMA 08/09/2010    Medication List:  Allergies as of 09/25/2018   No Known Allergies     Medication List       Accurate as of Sep 25, 2018 11:45 AM. Always use your most recent med list.        Advair HFA 115-21 MCG/ACT inhaler Generic drug:  fluticasone-salmeterol Inhale 2 puffs into the lungs 2 (two) times daily.   albuterol 108 (90 Base) MCG/ACT inhaler Commonly known as:  ProAir HFA INHALE 2 PUFFS 4 TIMES A DAY AS NEEDED FOR WHEEZING   budesonide 0.5 MG/2ML nebulizer solution Commonly known as:  PULMICORT USE 1 VIAL VIA NEBULIZER EVERY DAY   busPIRone 10 MG tablet Commonly known as:  BUSPAR TAKE 1 TABLET BY MOUTH TWICE A DAY   fluticasone 50 MCG/ACT nasal spray Commonly known as:  FLONASE PLACE 2 SPRAYS INTO THE NOSE DAILY.   montelukast 10 MG tablet Commonly known as:  SINGULAIR Take 1 tablet (10 mg total) by mouth at bedtime.       Birth History: non-contributory  Developmental History: non-contributory  Past Surgical History: History reviewed. No pertinent surgical history.   Family  History: History reviewed. No pertinent family history.   Social History: [name] lives at home with his family.  He is currently living in a house with wood and carpeting throughout the entire home.  He has electric heating and central cooling.  There is 1 dog in the home and 1 cat outside of the home.  He does have dust mite covers on his bedding.  There is no tobacco exposure.  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  They are doing a lot of takeout during the coronavirus pandemic.   Review of Systems  Constitutional: Negative.  Negative for fever, malaise/fatigue and weight loss.  HENT: Positive for congestion and sore throat. Negative for ear discharge and ear pain.   Eyes: Negative for pain, discharge and redness.  Respiratory: Positive for cough and wheezing. Negative for sputum production and shortness of breath.   Cardiovascular: Negative.  Negative for chest pain and palpitations.  Gastrointestinal: Negative for abdominal pain and heartburn.  Skin: Negative.  Negative for itching and rash.  Neurological: Negative for dizziness and headaches.  Endo/Heme/Allergies: Negative for environmental allergies. Does not bruise/bleed easily.       Objective:   Blood pressure 120/84, pulse 86, temperature 98.2 F (36.8 C), temperature source Tympanic, resp. rate 16, height  (1.753 m), weight 178 lb 9.6 oz (81 kg), SpO2 97 %. Body mass index is 26.37 kg/m.   Physical Exam:   Physical Exam  Constitutional: He appears well-developed.  HENT:  Head: Normocephalic and atraumatic.  Right Ear: Tympanic membrane, external ear and ear canal normal. No drainage, swelling or tenderness. Tympanic membrane is not injected, not scarred, not erythematous, not retracted and not bulging.  Left Ear: Tympanic membrane, external ear and ear canal normal. No drainage, swelling or tenderness. Tympanic membrane is not injected, not scarred, not erythematous,  not retracted and not bulging.  Nose: No mucosal edema, rhinorrhea, nasal deformity or septal deviation. No epistaxis. Right sinus exhibits no maxillary sinus tenderness and no frontal sinus tenderness. Left sinus exhibits no maxillary sinus tenderness and no frontal sinus tenderness.  Mouth/Throat: Uvula is midline and oropharynx is clear and moist. Mucous membranes are not pale and not dry.  Eyes: Pupils are equal, round, and reactive to light. Conjunctivae and EOM are normal. Right eye exhibits no chemosis and no discharge. Left eye exhibits no chemosis and no discharge. Right conjunctiva is not injected. Left conjunctiva is not injected.  Cardiovascular: Normal rate, regular rhythm and normal heart sounds.  Respiratory: Effort normal and breath sounds normal. No accessory muscle usage. No tachypnea. No respiratory distress. He has no wheezes. He has no rhonchi. He has no rales. He exhibits no tenderness.  GI: There is no abdominal tenderness. There is no rebound and no guarding.  Lymphadenopathy:       Head (right side): No submandibular, no tonsillar and no occipital adenopathy present.       Head (left side): No submandibular, no tonsillar and no occipital adenopathy present.    He has no cervical adenopathy.  Neurological: He is alert.  Skin: No abrasion, no petechiae and no rash noted. Rash is not papular, not vesicular and not urticarial. No erythema. No pallor.  Psychiatric: He has a normal mood and affect.     Diagnostic studies:    Allergy Studies:    Airborne Adult Perc - 09/25/18 1011    Time Antigen Placed  1010    Allergen Manufacturer  Waynette Buttery    Location  Back    Number of Test  59    Panel 1  Select    1. Control-Buffer 50% Glycerol  Negative    2. Control-Histamine 1 mg/ml  2+    3. Albumin saline  Negative    4. Bahia  3+    5. [place]  3+    6. Johnson  3+    7. [place] Blue  2+    8. Meadow Fescue  3+    9. Perennial Rye  3+    10. Sweet Vernal  3+    11.  Timothy  3+    12. Cocklebur  Negative    13. Burweed Marshelder  Negative    14. Ragweed, short  Negative    15. Ragweed, Giant  Negative    16. Plantain,  English  Negative    17. Lamb's Quarters  Negative    18. Sheep  Sorrell  Negative    19. Rough Pigweed  Negative    20. Marsh Elder, Rough  Negative    21. Mugwort, Common  Negative    22. Ash mix  2+    23. Birch mix  3+    24. Beech American  3+    25. Box, Elder  3+    26. Cedar, red  Negative    27. Cottonwood, Guinea-Bissau  Negative    28. Elm mix  Negative    29. Hickory mix  Negative    30. Maple mix  Negative    31. Oak, Guinea-Bissau mix  3+    32. Pecan Pollen  2+    33. Pine mix  2+    34. Sycamore Eastern  Negative    35. Walnut, Black Pollen  Negative    36. Alternaria alternata  Negative    37. Cladosporium Herbarum  Negative    38. Aspergillus mix  Negative    39. Penicillium mix  Negative    40. Bipolaris sorokiniana (Helminthosporium)  Negative    41. Drechslera spicifera (Curvularia)  Negative    42. Mucor plumbeus  Negative    43. Fusarium moniliforme  Negative    44. Aureobasidium pullulans (pullulara)  Negative    45. Rhizopus oryzae  Negative    46. Botrytis cinera  Negative    47. Epicoccum nigrum  Negative    48. Phoma betae  Negative    49. Candida Albicans  Negative    50. Trichophyton mentagrophytes  Negative    51. Mite, D Farinae  5,000 AU/ml  3+    52. Mite, D Pteronyssinus  5,000 AU/ml  3+    53. Cat Hair 10,000 BAU/ml  2+    54.  Dog Epithelia  Negative    55. Mixed Feathers  Negative    56. Horse Epithelia  Negative    57. Cockroach, German  Negative    58. Mouse  Negative    59. Tobacco Leaf  Negative     Intradermal - 09/25/18 1028    Time Antigen Placed  1028    Allergen Manufacturer  Waynette Buttery    Location  Back    Number of Test  9    Intradermal  Select    Control  Negative    Ragweed mix  2+    Weed mix  2+    Mold 1  3+    Mold 2  3+    Mold 3  Negative    Mold 4  3+    Dog   3+    Cockroach  3+       Allergy testing results were read and interpreted by myself, documented by clinical staff.         Malachi Bonds, MD [facility]

## 2018-09-27 DIAGNOSIS — J3089 Other allergic rhinitis: Secondary | ICD-10-CM

## 2018-10-01 DIAGNOSIS — J301 Allergic rhinitis due to pollen: Secondary | ICD-10-CM

## 2018-10-02 ENCOUNTER — Ambulatory Visit: Payer: PRIVATE HEALTH INSURANCE | Admitting: Psychology

## 2018-10-05 ENCOUNTER — Ambulatory Visit (INDEPENDENT_AMBULATORY_CARE_PROVIDER_SITE_OTHER): Payer: PRIVATE HEALTH INSURANCE | Admitting: Psychology

## 2018-10-05 DIAGNOSIS — F410 Panic disorder [episodic paroxysmal anxiety] without agoraphobia: Secondary | ICD-10-CM

## 2018-10-10 ENCOUNTER — Other Ambulatory Visit: Payer: Self-pay

## 2018-10-10 ENCOUNTER — Ambulatory Visit (INDEPENDENT_AMBULATORY_CARE_PROVIDER_SITE_OTHER): Payer: PRIVATE HEALTH INSURANCE | Admitting: *Deleted

## 2018-10-10 DIAGNOSIS — J309 Allergic rhinitis, unspecified: Secondary | ICD-10-CM | POA: Diagnosis not present

## 2018-10-10 NOTE — Progress Notes (Signed)
Immunotherapy   Patient Details  Name: Micheal Ford MRN: [id number] Date of Birth: [birth year]  10/10/2018  Newt Lukes started injections for  GRASS-WEED-TREE-CAT-DOG, RW-DM-MOLDS-CR Following schedule: B  Frequency: 1-2X Weekly Epi-Pen: Yes Consent signed and patient instructions given. Patient received .16mL of GRASS-WEED-TREE-CAT-DOG in LUA and .45mL of RW-DM-MOLDS-CR in RUA. Patient waited 30 minutes and did not experience any issues.   Ashleigh Fernandez-Vernon 10/10/2018, 2:21 PM

## 2018-10-14 ENCOUNTER — Other Ambulatory Visit: Payer: Self-pay | Admitting: Family Medicine

## 2018-10-16 ENCOUNTER — Ambulatory Visit (INDEPENDENT_AMBULATORY_CARE_PROVIDER_SITE_OTHER): Payer: PRIVATE HEALTH INSURANCE | Admitting: Psychology

## 2018-10-16 DIAGNOSIS — F410 Panic disorder [episodic paroxysmal anxiety] without agoraphobia: Secondary | ICD-10-CM

## 2018-10-16 NOTE — Telephone Encounter (Signed)
Please try and reschedule CPE with fasting labs prior with Dr. Patsy Lager.  CPE was cancelled in Feb.

## 2018-10-16 NOTE — Telephone Encounter (Signed)
Labs 7/24 Cpx 7/29 Pt aware

## 2018-10-26 ENCOUNTER — Ambulatory Visit (INDEPENDENT_AMBULATORY_CARE_PROVIDER_SITE_OTHER): Payer: PRIVATE HEALTH INSURANCE

## 2018-10-26 DIAGNOSIS — J309 Allergic rhinitis, unspecified: Secondary | ICD-10-CM

## 2018-10-30 ENCOUNTER — Ambulatory Visit: Payer: PRIVATE HEALTH INSURANCE | Admitting: Psychology

## 2018-11-01 ENCOUNTER — Ambulatory Visit (INDEPENDENT_AMBULATORY_CARE_PROVIDER_SITE_OTHER): Payer: PRIVATE HEALTH INSURANCE | Admitting: *Deleted

## 2018-11-01 DIAGNOSIS — J309 Allergic rhinitis, unspecified: Secondary | ICD-10-CM | POA: Diagnosis not present

## 2018-11-05 ENCOUNTER — Ambulatory Visit (INDEPENDENT_AMBULATORY_CARE_PROVIDER_SITE_OTHER): Payer: PRIVATE HEALTH INSURANCE | Admitting: Psychology

## 2018-11-05 DIAGNOSIS — F411 Generalized anxiety disorder: Secondary | ICD-10-CM

## 2018-11-08 ENCOUNTER — Ambulatory Visit (INDEPENDENT_AMBULATORY_CARE_PROVIDER_SITE_OTHER): Payer: PRIVATE HEALTH INSURANCE

## 2018-11-08 DIAGNOSIS — J309 Allergic rhinitis, unspecified: Secondary | ICD-10-CM | POA: Diagnosis not present

## 2018-11-13 ENCOUNTER — Ambulatory Visit (INDEPENDENT_AMBULATORY_CARE_PROVIDER_SITE_OTHER): Payer: PRIVATE HEALTH INSURANCE | Admitting: Psychology

## 2018-11-13 ENCOUNTER — Ambulatory Visit (INDEPENDENT_AMBULATORY_CARE_PROVIDER_SITE_OTHER): Payer: PRIVATE HEALTH INSURANCE | Admitting: *Deleted

## 2018-11-13 DIAGNOSIS — J309 Allergic rhinitis, unspecified: Secondary | ICD-10-CM

## 2018-11-13 DIAGNOSIS — F411 Generalized anxiety disorder: Secondary | ICD-10-CM

## 2018-11-20 ENCOUNTER — Ambulatory Visit (INDEPENDENT_AMBULATORY_CARE_PROVIDER_SITE_OTHER): Payer: PRIVATE HEALTH INSURANCE | Admitting: *Deleted

## 2018-11-20 DIAGNOSIS — J309 Allergic rhinitis, unspecified: Secondary | ICD-10-CM | POA: Diagnosis not present

## 2018-11-27 ENCOUNTER — Ambulatory Visit (INDEPENDENT_AMBULATORY_CARE_PROVIDER_SITE_OTHER): Payer: PRIVATE HEALTH INSURANCE | Admitting: Psychology

## 2018-11-27 DIAGNOSIS — F410 Panic disorder [episodic paroxysmal anxiety] without agoraphobia: Secondary | ICD-10-CM

## 2018-11-28 ENCOUNTER — Ambulatory Visit (INDEPENDENT_AMBULATORY_CARE_PROVIDER_SITE_OTHER): Payer: PRIVATE HEALTH INSURANCE | Admitting: *Deleted

## 2018-11-28 DIAGNOSIS — J309 Allergic rhinitis, unspecified: Secondary | ICD-10-CM | POA: Diagnosis not present

## 2018-12-06 ENCOUNTER — Ambulatory Visit (INDEPENDENT_AMBULATORY_CARE_PROVIDER_SITE_OTHER): Payer: PRIVATE HEALTH INSURANCE | Admitting: *Deleted

## 2018-12-06 DIAGNOSIS — J309 Allergic rhinitis, unspecified: Secondary | ICD-10-CM

## 2018-12-11 ENCOUNTER — Ambulatory Visit (INDEPENDENT_AMBULATORY_CARE_PROVIDER_SITE_OTHER): Payer: PRIVATE HEALTH INSURANCE | Admitting: Psychology

## 2018-12-11 DIAGNOSIS — F410 Panic disorder [episodic paroxysmal anxiety] without agoraphobia: Secondary | ICD-10-CM | POA: Diagnosis not present

## 2018-12-13 ENCOUNTER — Ambulatory Visit (INDEPENDENT_AMBULATORY_CARE_PROVIDER_SITE_OTHER): Payer: PRIVATE HEALTH INSURANCE | Admitting: *Deleted

## 2018-12-13 DIAGNOSIS — J309 Allergic rhinitis, unspecified: Secondary | ICD-10-CM | POA: Diagnosis not present

## 2018-12-14 ENCOUNTER — Other Ambulatory Visit: Payer: Self-pay

## 2018-12-17 ENCOUNTER — Other Ambulatory Visit (INDEPENDENT_AMBULATORY_CARE_PROVIDER_SITE_OTHER): Payer: PRIVATE HEALTH INSURANCE

## 2018-12-17 DIAGNOSIS — Z0000 Encounter for general adult medical examination without abnormal findings: Secondary | ICD-10-CM

## 2018-12-17 DIAGNOSIS — Z13220 Encounter for screening for lipoid disorders: Secondary | ICD-10-CM | POA: Diagnosis not present

## 2018-12-17 DIAGNOSIS — Z113 Encounter for screening for infections with a predominantly sexual mode of transmission: Secondary | ICD-10-CM

## 2018-12-17 DIAGNOSIS — Z131 Encounter for screening for diabetes mellitus: Secondary | ICD-10-CM | POA: Diagnosis not present

## 2018-12-18 LAB — CBC WITH DIFFERENTIAL/PLATELET
Basophils Absolute: 0 10*3/uL (ref 0.0–0.1)
Basophils Relative: 0.6 % (ref 0.0–3.0)
Eosinophils Absolute: 0.3 10*3/uL (ref 0.0–0.7)
Eosinophils Relative: 3.4 % (ref 0.0–5.0)
HCT: 45.8 % (ref 39.0–52.0)
Hemoglobin: 15.1 g/dL (ref 13.0–17.0)
Lymphocytes Relative: 25.6 % (ref 12.0–46.0)
Lymphs Abs: 2 10*3/uL (ref 0.7–4.0)
MCHC: 32.9 g/dL (ref 30.0–36.0)
MCV: 86.6 fl (ref 78.0–100.0)
Monocytes Absolute: 0.7 10*3/uL (ref 0.1–1.0)
Monocytes Relative: 8.7 % (ref 3.0–12.0)
Neutro Abs: 4.8 10*3/uL (ref 1.4–7.7)
Neutrophils Relative %: 61.7 % (ref 43.0–77.0)
Platelets: 287 10*3/uL (ref 150.0–400.0)
RBC: 5.28 Mil/uL (ref 4.22–5.81)
RDW: 13.6 % (ref 11.5–15.5)
WBC: 7.8 10*3/uL (ref 4.0–10.5)

## 2018-12-18 LAB — HEPATIC FUNCTION PANEL
ALT: 20 U/L (ref 0–53)
AST: 17 U/L (ref 0–37)
Albumin: 4.8 g/dL (ref 3.5–5.2)
Alkaline Phosphatase: 65 U/L (ref 39–117)
Bilirubin, Direct: 0.2 mg/dL (ref 0.0–0.3)
Total Bilirubin: 1.2 mg/dL (ref 0.2–1.2)
Total Protein: 7 g/dL (ref 6.0–8.3)

## 2018-12-18 LAB — BASIC METABOLIC PANEL
BUN: 9 mg/dL (ref 6–23)
CO2: 30 mEq/L (ref 19–32)
Calcium: 9.7 mg/dL (ref 8.4–10.5)
Chloride: 101 mEq/L (ref 96–112)
Creatinine, Ser: 1.17 mg/dL (ref 0.40–1.50)
GFR: 72.26 mL/min (ref >60.00)
Glucose, Bld: 119 mg/dL — ABNORMAL HIGH (ref 70–99)
Potassium: 4.3 mEq/L (ref 3.5–5.1)
Sodium: 138 mEq/L (ref 135–145)

## 2018-12-18 LAB — LIPID PANEL
Cholesterol: 184 mg/dL (ref 0–200)
HDL: 36.2 mg/dL — ABNORMAL LOW (ref >39.00)
LDL Cholesterol: 122 mg/dL — ABNORMAL HIGH (ref 0–99)
NonHDL: 147.6
Total CHOL/HDL Ratio: 5
Triglycerides: 130 mg/dL (ref 0.0–149.0)
VLDL: 26 mg/dL (ref 0.0–40.0)

## 2018-12-18 LAB — HIV ANTIBODY (ROUTINE TESTING W REFLEX): HIV 1&2 Ab, 4th Generation: NONREACTIVE

## 2018-12-18 LAB — HEMOGLOBIN A1C: Hgb A1c MFr Bld: 5.3 % (ref 4.6–6.5)

## 2018-12-19 ENCOUNTER — Ambulatory Visit (INDEPENDENT_AMBULATORY_CARE_PROVIDER_SITE_OTHER): Payer: PRIVATE HEALTH INSURANCE | Admitting: Family Medicine

## 2018-12-19 ENCOUNTER — Other Ambulatory Visit: Payer: Self-pay

## 2018-12-19 ENCOUNTER — Encounter: Payer: Self-pay | Admitting: Family Medicine

## 2018-12-19 VITALS — BP 112/72 | HR 84 | Temp 98.1°F | Ht 67.75 in | Wt 183.8 lb

## 2018-12-19 DIAGNOSIS — Z0000 Encounter for general adult medical examination without abnormal findings: Secondary | ICD-10-CM

## 2018-12-19 MED ORDER — FLUTICASONE PROPIONATE 50 MCG/ACT NA SUSP: NASAL | 3 refills | Status: DC

## 2018-12-19 NOTE — Progress Notes (Signed)
Micheal Mariani T. Harsha Yusko, MD Primary Care and Sports Medicine [facility] [address] [place], [id number] Phone: [phone]  FAX: [phone]  Micheal Ford - 33 y.o. male  MRN [id number]  Date of Birth: 07/29/1985  Visit Date: 12/19/2018  PCP: Hannah Beatopland, Char Feltman, MD  Referred by: Hannah Beatopland, Melitta Tigue, MD  Chief Complaint  Patient presents with  . Annual Exam   Patient Care Team: Hannah Beatopland, Satrina Magallanes, MD as PCP - General (Family Medicine) Subjective:   Micheal Ford is a 33 y.o. pleasant patient who presents with the following:  Preventative Health Maintenance Visit:  Health Maintenance Summary Reviewed and updated, unless pt declines services.  Tobacco History Reviewed. Alcohol: No concerns, no excessive use Exercise Habits: Some activity, rec at least 30 mins 5 times a week STD concerns: no risk or activity to increase risk Drug Use: None Encouraged self-testicular check  Tdap - hold  Health Maintenance  Topic Date Due  . TETANUS/TDAP  12/29/2005  . INFLUENZA VACCINE  12/22/2018  . HIV Screening  Completed   Immunization History  Administered Date(s) Administered  . Influenza,inj,Quad PF,6+ Mos 02/26/2018   Patient Active Problem List   Diagnosis Date Noted  . Seasonal and perennial allergic rhinitis 09/25/2018  . Moderate persistent asthma, uncomplicated 09/25/2018  . Acne 01/28/2013  . Stuttering 11/08/2010  . GAD (generalized anxiety disorder) 11/08/2010  . Allergic rhinitis 08/09/2010  . ASTHMA 08/09/2010   Past Medical History:  Diagnosis Date  . Allergy   . Asthma    History reviewed. No pertinent surgical history. Social History   Socioeconomic History  . Marital status: Single    Spouse name: Not on file  . Number of children: Not on file  . Years of education: Not on file  . Highest education level: Not on file  Occupational History  . Not on file  Social Needs  . Financial resource strain: Not on file   . Food insecurity    Worry: Not on file    Inability: Not on file  . Transportation needs    Medical: Not on file    Non-medical: Not on file  Tobacco Use  . Smoking status: Never Smoker  . Smokeless tobacco: Never Used  Substance and Sexual Activity  . Alcohol use: Yes    Comment: socially  . Drug use: No  . Sexual activity: Not on file  Lifestyle  . Physical activity    Days per week: Not on file    Minutes per session: Not on file  . Stress: Not on file  Relationships  . Social Musicianconnections    Talks on phone: Not on file    Gets together: Not on file    Attends religious service: Not on file    Active member of club or organization: Not on file    Attends meetings of clubs or organizations: Not on file    Relationship status: Not on file  . Intimate partner violence    Fear of current or ex partner: Not on file    Emotionally abused: Not on file    Physically abused: Not on file    Forced sexual activity: Not on file  Other Topics Concern  . Not on file  Social History Narrative  . Not on file   History reviewed. No pertinent family history. No Known Allergies  Medication list has been reviewed and updated.   General: Denies fever, chills, sweats. No significant weight loss. Eyes: Denies  blurring,significant itching ENT: Denies earache, sore throat, and hoarseness. Cardiovascular: Denies chest pains, palpitations, dyspnea on exertion Respiratory: Denies cough, dyspnea at rest,wheeezing Breast: no concerns about lumps GI: Denies nausea, vomiting, diarrhea, constipation, change in bowel habits, abdominal pain, melena, hematochezia GU: Denies penile discharge, ED, urinary flow / outflow problems. No STD concerns. Musculoskeletal: Denies back pain, joint pain Derm: Denies rash, itching Neuro: Denies  paresthesias, frequent falls, frequent headaches Psych: Denies depression, anxiety Endocrine: Denies cold intolerance, heat intolerance, polydipsia Heme: Denies  enlarged lymph nodes Allergy: No hayfever  Objective:   BP 112/72   Pulse 84   Temp 98.1 F (36.7 C) (Temporal)   Ht 5' 7.75" (1.721 m)   Wt 183 lb 12 oz (83.3 kg)   SpO2 98%   BMI 28.15 kg/m  Ideal Body Weight: Weight in (lb) to have BMI = 25: 162.9  Ideal Body Weight: Weight in (lb) to have BMI = 25: 162.9 No exam data present Depression screen [facility] 2/9 12/19/2018 04/17/2018  Decreased Interest 0 0  Down, Depressed, Hopeless 0 0  PHQ - 2 Score 0 0     GEN: well developed, well nourished, no acute distress Eyes: conjunctiva and lids normal, PERRLA, EOMI ENT: TM clear, nares clear, oral exam WNL Neck: supple, no lymphadenopathy, no thyromegaly, no JVD Pulm: clear to auscultation and percussion, respiratory effort normal CV: regular rate and rhythm, S1-S2, no murmur, rub or gallop, no bruits, peripheral pulses normal and symmetric, no cyanosis, clubbing, edema or varicosities GI: soft, non-tender; no hepatosplenomegaly, masses; active bowel sounds all quadrants GU: no hernia, testicular mass, penile discharge Lymph: no cervical, axillary or inguinal adenopathy MSK: gait normal, muscle tone and strength WNL, no joint swelling, effusions, discoloration, crepitus  SKIN: clear, good turgor, color WNL, no rashes, lesions, or ulcerations Neuro: normal mental status, normal strength, sensation, and motion Psych: alert; oriented to person, place and time, normally interactive and not anxious or depressed in appearance. All labs reviewed with patient. Results for orders placed or performed in visit on 12/17/18  Hemoglobin A1c  Result Value Ref Range   Hgb A1c MFr Bld 5.3 4.6 - 6.5 %  HIV Antibody (routine testing w rflx)  Result Value Ref Range   HIV 1&2 Ab, 4th Generation NON-REACTIVE NON-REACTI  CBC with Differential/Platelet  Result Value Ref Range   WBC 7.8 4.0 - 10.5 K/uL   RBC 5.28 4.22 - 5.81 Mil/uL   Hemoglobin 15.1 13.0 - 17.0 g/dL   HCT 16.145.8 09.639.0 - 04.552.0 %   MCV 86.6  78.0 - 100.0 fl   MCHC 32.9 30.0 - 36.0 g/dL   RDW 40.913.6 81.111.5 - 91.415.5 %   Platelets 287.0 150.0 - 400.0 K/uL   Neutrophils Relative % 61.7 43.0 - 77.0 %   Lymphocytes Relative 25.6 12.0 - 46.0 %   Monocytes Relative 8.7 3.0 - 12.0 %   Eosinophils Relative 3.4 0.0 - 5.0 %   Basophils Relative 0.6 0.0 - 3.0 %   Neutro Abs 4.8 1.4 - 7.7 K/uL   Lymphs Abs 2.0 0.7 - 4.0 K/uL   Monocytes Absolute 0.7 0.1 - 1.0 K/uL   Eosinophils Absolute 0.3 0.0 - 0.7 K/uL   Basophils Absolute 0.0 0.0 - 0.1 K/uL  Basic metabolic panel  Result Value Ref Range   Sodium 138 135 - 145 mEq/L   Potassium 4.3 3.5 - 5.1 mEq/L   Chloride 101 96 - 112 mEq/L   CO2 30 19 - 32 mEq/L   Glucose, Bld  119 (H) 70 - 99 mg/dL   BUN 9 6 - 23 mg/dL   Creatinine, Ser 1.17 0.40 - 1.50 mg/dL   Calcium 9.7 8.4 - 10.5 mg/dL   GFR 72.26 >60.00 mL/min  Hepatic function panel  Result Value Ref Range   Total Bilirubin 1.2 0.2 - 1.2 mg/dL   Bilirubin, Direct 0.2 0.0 - 0.3 mg/dL   Alkaline Phosphatase 65 39 - 117 U/L   AST 17 0 - 37 U/L   ALT 20 0 - 53 U/L   Total Protein 7.0 6.0 - 8.3 g/dL   Albumin 4.8 3.5 - 5.2 g/dL  Lipid panel  Result Value Ref Range   Cholesterol 184 0 - 200 mg/dL   Triglycerides 130.0 0.0 - 149.0 mg/dL   HDL 36.20 (L) >39.00 mg/dL   VLDL 26.0 0.0 - 40.0 mg/dL   LDL Cholesterol 122 (H) 0 - 99 mg/dL   Total CHOL/HDL Ratio 5    NonHDL 147.60     Assessment and Plan:     ICD-10-CM   1. Healthcare maintenance  Z00.00    Doing well, work on fitness and diet  Health Maintenance Exam: The patient's preventative maintenance and recommended screening tests for an annual wellness exam were reviewed in full today. Brought up to date unless services declined.  Counselled on the importance of diet, exercise, and its role in overall health and mortality. The patient's FH and SH was reviewed, including their home life, tobacco status, and drug and alcohol status.  Follow-up in 1 year for physical exam or  additional follow-up below.  Follow-up: No follow-ups on file. Or follow-up in 1 year if not noted.  Meds ordered this encounter  Medications  . fluticasone (FLONASE) 50 MCG/ACT nasal spray    Sig: PLACE 2 SPRAYS INTO THE NOSE DAILY.    Dispense:  48 g    Refill:  3   Medications Discontinued During This Encounter  Medication Reason  . fluticasone (FLONASE) 50 MCG/ACT nasal spray Reorder   No orders of the defined types were placed in this encounter.   Signed,  Maud Deed. Jocelyn Lowery, MD   Allergies as of 12/19/2018   No Known Allergies     Medication List       Accurate as of December 19, 2018  2:51 PM. If you have any questions, ask your nurse or doctor.        Advair HFA 115-21 MCG/ACT inhaler Generic drug: fluticasone-salmeterol Inhale 2 puffs into the lungs 2 (two) times daily.   albuterol 108 (90 Base) MCG/ACT inhaler Commonly known as: ProAir HFA INHALE 2 PUFFS 4 TIMES A DAY AS NEEDED FOR WHEEZING   budesonide 0.5 MG/2ML nebulizer solution Commonly known as: PULMICORT USE 1 VIAL VIA NEBULIZER EVERY DAY   busPIRone 10 MG tablet Commonly known as: BUSPAR TAKE 1 TABLET BY MOUTH TWICE A DAY   fluticasone 50 MCG/ACT nasal spray Commonly known as: FLONASE PLACE 2 SPRAYS INTO THE NOSE DAILY.   montelukast 10 MG tablet Commonly known as: SINGULAIR Take 1 tablet (10 mg total) by mouth at bedtime.

## 2018-12-20 ENCOUNTER — Ambulatory Visit (INDEPENDENT_AMBULATORY_CARE_PROVIDER_SITE_OTHER): Payer: PRIVATE HEALTH INSURANCE

## 2018-12-20 DIAGNOSIS — J309 Allergic rhinitis, unspecified: Secondary | ICD-10-CM

## 2018-12-25 ENCOUNTER — Ambulatory Visit (INDEPENDENT_AMBULATORY_CARE_PROVIDER_SITE_OTHER): Payer: PRIVATE HEALTH INSURANCE | Admitting: Psychology

## 2018-12-25 DIAGNOSIS — F410 Panic disorder [episodic paroxysmal anxiety] without agoraphobia: Secondary | ICD-10-CM

## 2018-12-27 ENCOUNTER — Ambulatory Visit (INDEPENDENT_AMBULATORY_CARE_PROVIDER_SITE_OTHER): Payer: PRIVATE HEALTH INSURANCE | Admitting: *Deleted

## 2018-12-27 ENCOUNTER — Ambulatory Visit: Payer: PRIVATE HEALTH INSURANCE | Admitting: Allergy & Immunology

## 2018-12-27 DIAGNOSIS — J309 Allergic rhinitis, unspecified: Secondary | ICD-10-CM

## 2019-01-01 ENCOUNTER — Other Ambulatory Visit: Payer: Self-pay

## 2019-01-01 ENCOUNTER — Encounter: Payer: Self-pay | Admitting: Allergy & Immunology

## 2019-01-01 ENCOUNTER — Ambulatory Visit (INDEPENDENT_AMBULATORY_CARE_PROVIDER_SITE_OTHER): Payer: PRIVATE HEALTH INSURANCE | Admitting: Allergy & Immunology

## 2019-01-01 VITALS — BP 122/80 | HR 76 | Temp 98.9°F | Resp 16 | Ht 67.0 in

## 2019-01-01 DIAGNOSIS — J3089 Other allergic rhinitis: Secondary | ICD-10-CM | POA: Diagnosis not present

## 2019-01-01 DIAGNOSIS — J302 Other seasonal allergic rhinitis: Secondary | ICD-10-CM | POA: Diagnosis not present

## 2019-01-01 DIAGNOSIS — J4540 Moderate persistent asthma, uncomplicated: Secondary | ICD-10-CM | POA: Diagnosis not present

## 2019-01-01 NOTE — Progress Notes (Signed)
FOLLOW UP  Date of Service/Encounter:  01/01/19   Assessment:   Moderate persistent asthma, uncomplicated  Seasonal and perennial allergic rhinitis (grasses, ragweed, weeds, trees, indoor molds, outdoor molds, dust mites, cat, dog and cockroach) - on allergen immunotherapy   Plan/Recommendations:   1. Moderate persistent asthma, uncomplicated - Breathing test looked awesome today. - I do not think that we need to make any changes today aside from stopping the Singulair.  - Daily controller medication(s): Advair 115/10mcg two puffs once daily with spacer - Prior to physical activity: albuterol 2 puffs 10-15 minutes before physical activity. - Rescue medications: albuterol 4 puffs every 4-6 hours as needed - Asthma control goals:  * Full participation in all desired activities (may need albuterol before activity) * Albuterol use two time or less a week on average (not counting use with activity) * Cough interfering with sleep two time or less a month * Oral steroids no more than once a year * No hospitalizations  2. Chronic rhinitis (grasses, ragweed, weeds, trees, indoor molds, outdoor molds, dust mites, cat, dog and cockroach) - Continue with allergy shots at the same schedule. - Stop the Singulair (montelukast) 10mg  daily.  - Be sure to take an antihistamine (like Xyzal or Zyrtec) on shot days to prevent any reactions.   - Continue with: Flonase (fluticasone) two sprays per nostril daily  3. Return in about 6 months (around 07/04/2019). This can be an in-person, a virtual Webex or a telephone follow up visit.   Subjective:   Micheal Ford is a 33 y.o. male presenting today for follow up of  Chief Complaint  Patient presents with  . Allergic Rhinitis     Micheal Ford has a history of the following: Patient Active Problem List   Diagnosis Date Noted  . Seasonal and perennial allergic rhinitis 09/25/2018  . Moderate persistent asthma, uncomplicated 37/62/8315  .  Acne 01/28/2013  . Stuttering 11/08/2010  . GAD (generalized anxiety disorder) 11/08/2010  . Allergic rhinitis 08/09/2010  . ASTHMA 08/09/2010    History obtained from: chart review and patient.  Micheal Ford is a 33 y.o. male presenting for a follow up visit.  He was last seen as a new patient in May 2020.  At that time, we continued Singulair and Advair 115/21 mcg 2 puffs twice daily.  He had testing that was positive to grasses, ragweed, weeds, trees, indoor and outdoor molds, dust mite, cat, dog, and cockroach.  We continued with Singulair and Flonase and added Xyzal.  We also discussed allergen immunotherapy and he decided to go ahead and start that.  Since last visit, he has done very well.  He reports that the allergy shots seem to be helping his symptoms quite a bit.  Asthma/Respiratory Symptom History: He is actually decreased his Advair to 2 puffs once in the morning.  This seems to be controlling his asthma symptoms without a problem.  ACT score is 25, indicating excellent asthma control.  He has not been to the hospital or clinic for any prednisone.  He reports good sleep at night.  Allergic Rhinitis Symptom History: He remains on his allergen immunotherapy.  He has been very regular about coming.  He denies any reactions to the injections.  Otherwise, there have been no changes to his past medical history, surgical history, family history, or social history.  He continues to work at [employer] in [place].    Review of Systems  Constitutional: Negative.  Negative for chills, fever,  malaise/fatigue and weight loss.  HENT: Negative.  Negative for congestion, ear discharge, ear pain and sore throat.   Eyes: Negative for pain, discharge and redness.  Respiratory: Positive for cough. Negative for sputum production, shortness of breath and wheezing.   Cardiovascular: Negative.  Negative for chest pain and palpitations.  Gastrointestinal: Negative for abdominal pain, constipation,  diarrhea, heartburn, nausea and vomiting.  Skin: Negative.  Negative for itching and rash.  Neurological: Negative for dizziness and headaches.  Endo/Heme/Allergies: Negative for environmental allergies. Does not bruise/bleed easily.       Objective:   Blood pressure 122/80, pulse 76, temperature 98.9 F (37.2 C), temperature source Tympanic, resp. rate 16, height 5\' 7"  (1.702 m), SpO2 97 %. Body mass index is 28.78 kg/m.   Physical Exam:  Physical Exam  Constitutional: He appears well-developed.  Very cooperative with the exam.  HENT:  Head: Normocephalic and atraumatic.  Right Ear: Tympanic membrane, external ear and ear canal normal.  Left Ear: Tympanic membrane, external ear and ear canal normal.  Nose: Mucosal edema and rhinorrhea present. No nasal deformity or septal deviation. No epistaxis. Right sinus exhibits no maxillary sinus tenderness and no frontal sinus tenderness. Left sinus exhibits no maxillary sinus tenderness and no frontal sinus tenderness.  Mouth/Throat: Uvula is midline and oropharynx is clear and moist. Mucous membranes are not pale and not dry.  Cobblestoning present in the posterior oropharynx.  Eyes: Pupils are equal, round, and reactive to light. Conjunctivae and EOM are normal. Right eye exhibits no chemosis and no discharge. Left eye exhibits no chemosis and no discharge. Right conjunctiva is not injected. Left conjunctiva is not injected.  Cardiovascular: Normal rate, regular rhythm and normal heart sounds.  Respiratory: Effort normal and breath sounds normal. No accessory muscle usage. No tachypnea. No respiratory distress. He has no wheezes. He has no rhonchi. He has no rales. He exhibits no tenderness.  Moving air well in all lung fields.  No increased work of breathing.  Lymphadenopathy:    He has no cervical adenopathy.  Neurological: He is alert.  Skin: No abrasion, no petechiae and no rash noted. Rash is not papular, not vesicular and not  urticarial. No erythema. No pallor.  Psychiatric: He has a normal mood and affect.     Diagnostic studies:    Spirometry: results normal (FEV1: 3.16/78%, FVC: 4.47/90%, FEV1/FVC: 71%).    Spirometry consistent with normal pattern.   Allergy Studies: none        Malachi BondsJoel Shahir Karen, MD  [facility]

## 2019-01-01 NOTE — Patient Instructions (Addendum)
1. Moderate persistent asthma, uncomplicated - Breathing test looked awesome today. - I do not think that we need to make any changes today aside from stopping the Singulair.  - Daily controller medication(s): Advair 115/43mcg two puffs once daily with spacer - Prior to physical activity: albuterol 2 puffs 10-15 minutes before physical activity. - Rescue medications: albuterol 4 puffs every 4-6 hours as needed - Asthma control goals:  * Full participation in all desired activities (may need albuterol before activity) * Albuterol use two time or less a week on average (not counting use with activity) * Cough interfering with sleep two time or less a month * Oral steroids no more than once a year * No hospitalizations  2. Chronic rhinitis (grasses, ragweed, weeds, trees, indoor molds, outdoor molds, dust mites, cat, dog and cockroach) - Continue with allergy shots at the same schedule. - Stop the Singulair (montelukast) 10mg  daily.  - Be sure to take an antihistamine (like Xyzal or Zyrtec) on shot days to prevent any reactions.   - Continue with: Flonase (fluticasone) two sprays per nostril daily  3. Return in about 6 months (around 07/04/2019). This can be an in-person, a virtual Webex or a telephone follow up visit.   Please inform us of any Emergency Department visits, hospitalizations, or changes in symptoms. Call us before going to the ED for breathing or allergy symptoms since we might be able to fit you in for a sick visit. Feel free to contact us anytime with any questions, problems, or concerns.  It was a pleasure to see you again today!  Websites that have reliable patient information: 1. American Academy of Asthma, Allergy, and Immunology: www.aaaai.org 2. Food Allergy Research and Education (FARE): foodallergy.org 3. Mothers of Asthmatics: http://www.asthmacommunitynetwork.org 4. American College of Allergy, Asthma, and Immunology: www.acaai.org  "Like" [place] on Facebook and  Instagram for our latest updates!      Make sure you are registered to vote! If you have moved or changed any of your contact information, you will need to get this updated before voting!  In some cases, you MAY be able to register to vote online: [email or web address]    Voter ID laws are NOT going into effect for the General Election in November 2020! DO NOT let this stop you from exercising your right to vote!   Absentee voting is the SAFEST way to vote during the coronavirus pandemic!   Download and print an absentee ballot request form at [email or web address] or you can scan the QR code below with your smart phone:      More information on absentee ballots can be found here: [email or web address]

## 2019-01-03 ENCOUNTER — Ambulatory Visit (INDEPENDENT_AMBULATORY_CARE_PROVIDER_SITE_OTHER): Payer: PRIVATE HEALTH INSURANCE | Admitting: *Deleted

## 2019-01-03 DIAGNOSIS — J309 Allergic rhinitis, unspecified: Secondary | ICD-10-CM

## 2019-01-08 ENCOUNTER — Ambulatory Visit (INDEPENDENT_AMBULATORY_CARE_PROVIDER_SITE_OTHER): Payer: PRIVATE HEALTH INSURANCE | Admitting: Psychology

## 2019-01-08 DIAGNOSIS — F410 Panic disorder [episodic paroxysmal anxiety] without agoraphobia: Secondary | ICD-10-CM | POA: Diagnosis not present

## 2019-01-10 ENCOUNTER — Ambulatory Visit (INDEPENDENT_AMBULATORY_CARE_PROVIDER_SITE_OTHER): Payer: PRIVATE HEALTH INSURANCE | Admitting: *Deleted

## 2019-01-10 DIAGNOSIS — J309 Allergic rhinitis, unspecified: Secondary | ICD-10-CM

## 2019-01-15 ENCOUNTER — Other Ambulatory Visit: Payer: Self-pay | Admitting: Family Medicine

## 2019-01-17 ENCOUNTER — Ambulatory Visit (INDEPENDENT_AMBULATORY_CARE_PROVIDER_SITE_OTHER): Payer: PRIVATE HEALTH INSURANCE | Admitting: *Deleted

## 2019-01-17 DIAGNOSIS — J309 Allergic rhinitis, unspecified: Secondary | ICD-10-CM

## 2019-01-22 ENCOUNTER — Ambulatory Visit (INDEPENDENT_AMBULATORY_CARE_PROVIDER_SITE_OTHER): Payer: PRIVATE HEALTH INSURANCE | Admitting: Psychology

## 2019-01-22 DIAGNOSIS — F410 Panic disorder [episodic paroxysmal anxiety] without agoraphobia: Secondary | ICD-10-CM | POA: Diagnosis not present

## 2019-01-24 ENCOUNTER — Ambulatory Visit (INDEPENDENT_AMBULATORY_CARE_PROVIDER_SITE_OTHER): Payer: PRIVATE HEALTH INSURANCE | Admitting: *Deleted

## 2019-01-24 DIAGNOSIS — J309 Allergic rhinitis, unspecified: Secondary | ICD-10-CM

## 2019-01-31 ENCOUNTER — Ambulatory Visit (INDEPENDENT_AMBULATORY_CARE_PROVIDER_SITE_OTHER): Payer: PRIVATE HEALTH INSURANCE | Admitting: *Deleted

## 2019-01-31 DIAGNOSIS — J309 Allergic rhinitis, unspecified: Secondary | ICD-10-CM | POA: Diagnosis not present

## 2019-02-05 ENCOUNTER — Ambulatory Visit (INDEPENDENT_AMBULATORY_CARE_PROVIDER_SITE_OTHER): Payer: PRIVATE HEALTH INSURANCE | Admitting: Psychology

## 2019-02-05 DIAGNOSIS — F410 Panic disorder [episodic paroxysmal anxiety] without agoraphobia: Secondary | ICD-10-CM | POA: Diagnosis not present

## 2019-02-07 ENCOUNTER — Ambulatory Visit (INDEPENDENT_AMBULATORY_CARE_PROVIDER_SITE_OTHER): Payer: PRIVATE HEALTH INSURANCE

## 2019-02-07 DIAGNOSIS — J309 Allergic rhinitis, unspecified: Secondary | ICD-10-CM

## 2019-02-14 ENCOUNTER — Ambulatory Visit (INDEPENDENT_AMBULATORY_CARE_PROVIDER_SITE_OTHER): Payer: PRIVATE HEALTH INSURANCE

## 2019-02-14 DIAGNOSIS — J309 Allergic rhinitis, unspecified: Secondary | ICD-10-CM

## 2019-02-19 ENCOUNTER — Ambulatory Visit (INDEPENDENT_AMBULATORY_CARE_PROVIDER_SITE_OTHER): Payer: PRIVATE HEALTH INSURANCE | Admitting: Psychology

## 2019-02-19 DIAGNOSIS — F410 Panic disorder [episodic paroxysmal anxiety] without agoraphobia: Secondary | ICD-10-CM | POA: Diagnosis not present

## 2019-02-21 ENCOUNTER — Ambulatory Visit (INDEPENDENT_AMBULATORY_CARE_PROVIDER_SITE_OTHER): Payer: PRIVATE HEALTH INSURANCE | Admitting: *Deleted

## 2019-02-21 DIAGNOSIS — J309 Allergic rhinitis, unspecified: Secondary | ICD-10-CM | POA: Diagnosis not present

## 2019-02-28 ENCOUNTER — Ambulatory Visit (INDEPENDENT_AMBULATORY_CARE_PROVIDER_SITE_OTHER): Payer: PRIVATE HEALTH INSURANCE | Admitting: *Deleted

## 2019-02-28 DIAGNOSIS — J309 Allergic rhinitis, unspecified: Secondary | ICD-10-CM

## 2019-03-05 ENCOUNTER — Ambulatory Visit (INDEPENDENT_AMBULATORY_CARE_PROVIDER_SITE_OTHER): Payer: PRIVATE HEALTH INSURANCE | Admitting: Psychology

## 2019-03-05 DIAGNOSIS — F410 Panic disorder [episodic paroxysmal anxiety] without agoraphobia: Secondary | ICD-10-CM

## 2019-03-07 ENCOUNTER — Ambulatory Visit (INDEPENDENT_AMBULATORY_CARE_PROVIDER_SITE_OTHER): Payer: PRIVATE HEALTH INSURANCE

## 2019-03-07 DIAGNOSIS — J309 Allergic rhinitis, unspecified: Secondary | ICD-10-CM

## 2019-03-14 ENCOUNTER — Ambulatory Visit (INDEPENDENT_AMBULATORY_CARE_PROVIDER_SITE_OTHER): Payer: PRIVATE HEALTH INSURANCE | Admitting: *Deleted

## 2019-03-14 DIAGNOSIS — J309 Allergic rhinitis, unspecified: Secondary | ICD-10-CM

## 2019-03-19 ENCOUNTER — Ambulatory Visit (INDEPENDENT_AMBULATORY_CARE_PROVIDER_SITE_OTHER): Payer: PRIVATE HEALTH INSURANCE | Admitting: Psychology

## 2019-03-19 ENCOUNTER — Ambulatory Visit: Payer: PRIVATE HEALTH INSURANCE | Admitting: Psychology

## 2019-03-19 DIAGNOSIS — F410 Panic disorder [episodic paroxysmal anxiety] without agoraphobia: Secondary | ICD-10-CM | POA: Diagnosis not present

## 2019-03-28 ENCOUNTER — Ambulatory Visit (INDEPENDENT_AMBULATORY_CARE_PROVIDER_SITE_OTHER): Payer: PRIVATE HEALTH INSURANCE

## 2019-03-28 DIAGNOSIS — J309 Allergic rhinitis, unspecified: Secondary | ICD-10-CM | POA: Diagnosis not present

## 2019-04-02 ENCOUNTER — Ambulatory Visit (INDEPENDENT_AMBULATORY_CARE_PROVIDER_SITE_OTHER): Payer: PRIVATE HEALTH INSURANCE | Admitting: Psychology

## 2019-04-02 ENCOUNTER — Ambulatory Visit: Payer: PRIVATE HEALTH INSURANCE | Admitting: Psychology

## 2019-04-02 DIAGNOSIS — F410 Panic disorder [episodic paroxysmal anxiety] without agoraphobia: Secondary | ICD-10-CM | POA: Diagnosis not present

## 2019-04-11 ENCOUNTER — Ambulatory Visit (INDEPENDENT_AMBULATORY_CARE_PROVIDER_SITE_OTHER): Payer: PRIVATE HEALTH INSURANCE

## 2019-04-11 DIAGNOSIS — J309 Allergic rhinitis, unspecified: Secondary | ICD-10-CM

## 2019-04-16 ENCOUNTER — Ambulatory Visit (INDEPENDENT_AMBULATORY_CARE_PROVIDER_SITE_OTHER): Payer: PRIVATE HEALTH INSURANCE | Admitting: Psychology

## 2019-04-16 DIAGNOSIS — F410 Panic disorder [episodic paroxysmal anxiety] without agoraphobia: Secondary | ICD-10-CM

## 2019-04-17 ENCOUNTER — Ambulatory Visit (INDEPENDENT_AMBULATORY_CARE_PROVIDER_SITE_OTHER): Payer: PRIVATE HEALTH INSURANCE | Admitting: *Deleted

## 2019-04-17 DIAGNOSIS — J309 Allergic rhinitis, unspecified: Secondary | ICD-10-CM | POA: Diagnosis not present

## 2019-04-22 ENCOUNTER — Other Ambulatory Visit: Payer: Self-pay | Admitting: Family Medicine

## 2019-04-25 ENCOUNTER — Ambulatory Visit (INDEPENDENT_AMBULATORY_CARE_PROVIDER_SITE_OTHER): Payer: PRIVATE HEALTH INSURANCE | Admitting: *Deleted

## 2019-04-25 DIAGNOSIS — J309 Allergic rhinitis, unspecified: Secondary | ICD-10-CM

## 2019-04-30 ENCOUNTER — Ambulatory Visit (INDEPENDENT_AMBULATORY_CARE_PROVIDER_SITE_OTHER): Payer: Self-pay | Admitting: Psychology

## 2019-04-30 DIAGNOSIS — F410 Panic disorder [episodic paroxysmal anxiety] without agoraphobia: Secondary | ICD-10-CM

## 2019-05-09 ENCOUNTER — Ambulatory Visit (INDEPENDENT_AMBULATORY_CARE_PROVIDER_SITE_OTHER): Payer: PRIVATE HEALTH INSURANCE | Admitting: *Deleted

## 2019-05-09 DIAGNOSIS — J309 Allergic rhinitis, unspecified: Secondary | ICD-10-CM

## 2019-05-10 ENCOUNTER — Other Ambulatory Visit: Payer: Self-pay | Admitting: Family Medicine

## 2019-05-14 ENCOUNTER — Ambulatory Visit (INDEPENDENT_AMBULATORY_CARE_PROVIDER_SITE_OTHER): Payer: Self-pay | Admitting: Psychology

## 2019-05-14 DIAGNOSIS — F410 Panic disorder [episodic paroxysmal anxiety] without agoraphobia: Secondary | ICD-10-CM

## 2019-05-21 ENCOUNTER — Ambulatory Visit (INDEPENDENT_AMBULATORY_CARE_PROVIDER_SITE_OTHER): Payer: PRIVATE HEALTH INSURANCE

## 2019-05-21 DIAGNOSIS — J309 Allergic rhinitis, unspecified: Secondary | ICD-10-CM

## 2019-05-28 ENCOUNTER — Ambulatory Visit (INDEPENDENT_AMBULATORY_CARE_PROVIDER_SITE_OTHER): Payer: 59 | Admitting: Psychology

## 2019-05-28 DIAGNOSIS — F410 Panic disorder [episodic paroxysmal anxiety] without agoraphobia: Secondary | ICD-10-CM

## 2019-05-30 ENCOUNTER — Ambulatory Visit (INDEPENDENT_AMBULATORY_CARE_PROVIDER_SITE_OTHER): Payer: 59

## 2019-05-30 DIAGNOSIS — J309 Allergic rhinitis, unspecified: Secondary | ICD-10-CM

## 2019-06-06 ENCOUNTER — Ambulatory Visit (INDEPENDENT_AMBULATORY_CARE_PROVIDER_SITE_OTHER): Payer: 59

## 2019-06-06 DIAGNOSIS — J309 Allergic rhinitis, unspecified: Secondary | ICD-10-CM

## 2019-06-11 ENCOUNTER — Ambulatory Visit (INDEPENDENT_AMBULATORY_CARE_PROVIDER_SITE_OTHER): Payer: 59 | Admitting: Psychology

## 2019-06-11 DIAGNOSIS — F410 Panic disorder [episodic paroxysmal anxiety] without agoraphobia: Secondary | ICD-10-CM

## 2019-06-13 ENCOUNTER — Ambulatory Visit (INDEPENDENT_AMBULATORY_CARE_PROVIDER_SITE_OTHER): Payer: 59

## 2019-06-13 DIAGNOSIS — J309 Allergic rhinitis, unspecified: Secondary | ICD-10-CM

## 2019-06-17 DIAGNOSIS — J301 Allergic rhinitis due to pollen: Secondary | ICD-10-CM | POA: Diagnosis not present

## 2019-06-17 NOTE — Progress Notes (Signed)
VIALS EXP 06-16-20 

## 2019-06-21 ENCOUNTER — Ambulatory Visit (INDEPENDENT_AMBULATORY_CARE_PROVIDER_SITE_OTHER): Payer: 59 | Admitting: *Deleted

## 2019-06-21 DIAGNOSIS — J309 Allergic rhinitis, unspecified: Secondary | ICD-10-CM | POA: Diagnosis not present

## 2019-06-25 ENCOUNTER — Ambulatory Visit (INDEPENDENT_AMBULATORY_CARE_PROVIDER_SITE_OTHER): Payer: 59 | Admitting: Psychology

## 2019-06-25 DIAGNOSIS — F410 Panic disorder [episodic paroxysmal anxiety] without agoraphobia: Secondary | ICD-10-CM | POA: Diagnosis not present

## 2019-06-28 ENCOUNTER — Ambulatory Visit (INDEPENDENT_AMBULATORY_CARE_PROVIDER_SITE_OTHER): Payer: 59 | Admitting: *Deleted

## 2019-06-28 DIAGNOSIS — J309 Allergic rhinitis, unspecified: Secondary | ICD-10-CM

## 2019-06-30 ENCOUNTER — Other Ambulatory Visit: Payer: Self-pay | Admitting: Family Medicine

## 2019-07-04 ENCOUNTER — Encounter: Payer: Self-pay | Admitting: Allergy & Immunology

## 2019-07-04 ENCOUNTER — Ambulatory Visit (INDEPENDENT_AMBULATORY_CARE_PROVIDER_SITE_OTHER): Payer: 59 | Admitting: Allergy & Immunology

## 2019-07-04 ENCOUNTER — Other Ambulatory Visit: Payer: Self-pay

## 2019-07-04 VITALS — BP 122/90 | HR 78 | Temp 97.9°F | Resp 16 | Ht 67.0 in | Wt 201.0 lb

## 2019-07-04 DIAGNOSIS — J302 Other seasonal allergic rhinitis: Secondary | ICD-10-CM | POA: Diagnosis not present

## 2019-07-04 DIAGNOSIS — J3089 Other allergic rhinitis: Secondary | ICD-10-CM | POA: Diagnosis not present

## 2019-07-04 DIAGNOSIS — J4540 Moderate persistent asthma, uncomplicated: Secondary | ICD-10-CM

## 2019-07-04 NOTE — Patient Instructions (Addendum)
1. Moderate persistent asthma, uncomplicated - Breathing test looked awesome today. - Spacer sample and teaching provided today.  - Daily controller medication(s): Advair 115/76mcg two puffs twice daily with spacer - Prior to physical activity: albuterol 2 puffs 10-15 minutes before physical activity. - Rescue medications: albuterol 4 puffs every 4-6 hours as needed - Asthma control goals:  * Full participation in all desired activities (may need albuterol before activity) * Albuterol use two time or less a week on average (not counting use with activity) * Cough interfering with sleep two time or less a month * Oral steroids no more than once a year * No hospitalizations  2. Chronic rhinitis (grasses, ragweed, weeds, trees, indoor molds, outdoor molds, dust mites, cat, dog and cockroach) - Continue with allergy shots at the same schedule. - Be sure to take an antihistamine (like Xyzal or Zyrtec) on shot days to prevent any reactions.   - Continue with: Flonase (fluticasone) two sprays per nostril daily and Singulair as needed.   3. Return in about 6 months (around 01/01/2020). This can be an in-person, a virtual Webex or a telephone follow up visit.   Please inform us of any Emergency Department visits, hospitalizations, or changes in symptoms. Call us before going to the ED for breathing or allergy symptoms since we might be able to fit you in for a sick visit. Feel free to contact us anytime with any questions, problems, or concerns.  It was a pleasure to see you again today!  Websites that have reliable patient information: 1. American Academy of Asthma, Allergy, and Immunology: www.aaaai.org 2. Food Allergy Research and Education (FARE): foodallergy.org 3. Mothers of Asthmatics: http://www.asthmacommunitynetwork.org 4. American College of Allergy, Asthma, and Immunology: www.acaai.org   COVID-19 Vaccine Information can be found at:  [email or web address] For questions related to vaccine distribution or appointments, please email [email or web address] or call [phone].     "Like" [place] on Facebook and Instagram for our latest updates!        Make sure you are registered to vote! If you have moved or changed any of your contact information, you will need to get this updated before voting!  In some cases, you MAY be able to register to vote online: [email or web address]

## 2019-07-04 NOTE — Progress Notes (Signed)
FOLLOW UP  Date of Service/Encounter:  07/04/19   Assessment:   Moderate persistent asthma, uncomplicated  Seasonal and perennial allergic rhinitis(grasses, ragweed, weeds, trees, indoor molds, outdoor molds, dust mites, cat, dog and cockroach) - on allergen immunotherapy   Micheal Ford presents for follow-up visit.  Micheal Ford is doing very well with regards to his allergen immunotherapy.  His asthma seems under good control.  We are not can make any medication changes today.  We have already made his Singulair and as needed medication at the last visit.  We had discussed changing his Advair to once a day, but Micheal Ford prefers to keep it twice a day.  There is certainly no hurry to stepdown his therapy, but I do anticipate decreasing the dose and/or frequency of his inhaler over the next 12 to 18 months since Micheal Ford is in his most concentrated allergy vial.   Plan/Recommendations:   1. Moderate persistent asthma, uncomplicated - Breathing test looked awesome today. - Spacer sample and teaching provided today.  - Daily controller medication(s): Advair 115/3mcg two puffs twice daily with spacer - Prior to physical activity: albuterol 2 puffs 10-15 minutes before physical activity. - Rescue medications: albuterol 4 puffs every 4-6 hours as needed - Asthma control goals:  * Full participation in all desired activities (may need albuterol before activity) * Albuterol use two time or less a week on average (not counting use with activity) * Cough interfering with sleep two time or less a month * Oral steroids no more than once a year * No hospitalizations  2. Chronic rhinitis (grasses, ragweed, weeds, trees, indoor molds, outdoor molds, dust mites, cat, dog and cockroach) - Continue with allergy shots at the same schedule. - Be sure to take an antihistamine (like Xyzal or Zyrtec) on shot days to prevent any reactions.   - Continue with: Flonase (fluticasone) two sprays per nostril daily and Singulair  as needed.   3. Return in about 6 months (around 01/01/2020). This can be an in-person, a virtual Webex or a telephone follow up visit.  Subjective:   Micheal Ford is a 34 y.o. male presenting today for follow up of  Chief Complaint  Patient presents with  . Asthma  . Allergic Rhinitis     Micheal Ford has a history of the following: Patient Active Problem List   Diagnosis Date Noted  . Seasonal and perennial allergic rhinitis 09/25/2018  . Moderate persistent asthma, uncomplicated 14/43/1540  . Acne 01/28/2013  . Stuttering 11/08/2010  . GAD (generalized anxiety disorder) 11/08/2010  . Allergic rhinitis 08/09/2010  . ASTHMA 08/09/2010    History obtained from: chart review and patient.   Micheal Ford is a 34 y.o. male presenting for a follow up visit.  Micheal Ford was last seen in August 2020.  At that time, his breathing test look great.  We stopped his Singulair but continued Advair 115/21 mcg 2 puffs once daily.  We also continued albuterol 10 to 15 minutes before physical activity and as needed every 4-6 hours.  For his allergic rhinitis, would continue with allergy shots.  We stopped Singulair and recommended an antihistamine on shot days and Flonase 2 sprays per nostril daily.  Since last visit, Micheal Ford has done very well.  Micheal Ford is now interviewing for a job at an [employer].  Micheal Ford was previously working at [employer].  Micheal Ford tells me that Micheal Ford was called back for a second interview, which Micheal Ford is doing on Monday.  Micheal Ford is quite excited  about this.  Asthma/Respiratory Symptom History: Micheal Ford remains on the Advair 115/21 mcg 2 puffs twice daily.  Micheal Ford is not using a spacer but is open to getting one today.  ACT score is 25, indicating excellent asthma control.  Micheal Ford has not needed any prednisone or emergency room visits for his breathing.  Micheal Ford has not been using his rescue inhaler.  Allergic Rhinitis Symptom History: Micheal Ford remains on the antihistamine, which Micheal Ford tends to take only on shot days.  Micheal Ford has on the Flonase  every day.  We did try stopping the Singulair at the last visit, but Micheal Ford noticed that during the change of seasons, Micheal Ford would require it for the best symptom control for a few days.  Therefore Micheal Ford has not stopped it completely.   Micheal Ford is on allergen immunotherapy. Micheal Ford receives two injections. Immunotherapy script #1 contains trees, weeds, grasses, cat and dog. Micheal Ford currently receives 0.61mL of the RED vial (1/100). Immunotherapy script #2 contains ragweed, molds, dust mites and cockroach. Micheal Ford currently receives 0.33mL of the RED vial (1/100). Micheal Ford started shots May of 2020 and reached maintenance in October of 2020.  Otherwise, there have been no changes to his past medical history, surgical history, family history, or social history.    Review of Systems  Constitutional: Negative.  Negative for chills, fever, malaise/fatigue and weight loss.  HENT: Negative.  Negative for congestion, ear discharge, ear pain, nosebleeds and sore throat.   Eyes: Negative for pain, discharge and redness.  Respiratory: Negative for cough, sputum production, shortness of breath and wheezing.   Cardiovascular: Negative.  Negative for chest pain and palpitations.  Gastrointestinal: Negative for abdominal pain, constipation, diarrhea, heartburn, nausea and vomiting.  Skin: Negative.  Negative for itching and rash.  Neurological: Negative for dizziness and headaches.  Endo/Heme/Allergies: Negative for environmental allergies. Does not bruise/bleed easily.       Objective:   Blood pressure 122/90, pulse 78, temperature 97.9 F (36.6 C), temperature source Temporal, resp. rate 16, height 5\' 7"  (1.702 m), weight 201 lb (91.2 kg), SpO2 96 %. Body mass index is 31.48 kg/m.   Physical Exam:  Physical Exam  Constitutional: Micheal Ford appears well-developed.  Pleasant male. Talkative.   HENT:  Head: Normocephalic and atraumatic.  Right Ear: Tympanic membrane, external ear and ear canal normal.  Left Ear: Tympanic membrane,  external ear and ear canal normal.  Nose: Mucosal edema present. No rhinorrhea, nasal deformity or septal deviation. No epistaxis. Right sinus exhibits no maxillary sinus tenderness and no frontal sinus tenderness. Left sinus exhibits no maxillary sinus tenderness and no frontal sinus tenderness.  Mouth/Throat: Uvula is midline and oropharynx is clear and moist. Mucous membranes are not pale and not dry.  Tonsils normal bilaterally without exudates.  Eyes: Pupils are equal, round, and reactive to light. Conjunctivae and EOM are normal. Right eye exhibits no chemosis and no discharge. Left eye exhibits no chemosis and no discharge. Right conjunctiva is not injected. Left conjunctiva is not injected.  Cardiovascular: Normal rate, regular rhythm and normal heart sounds.  Respiratory: Effort normal and breath sounds normal. No accessory muscle usage. No tachypnea. No respiratory distress. Micheal Ford has no wheezes. Micheal Ford has no rhonchi. Micheal Ford has no rales. Micheal Ford exhibits no tenderness.  Moving air well in all lung fields. No increased work of breathing noted.   Lymphadenopathy:    Micheal Ford has no cervical adenopathy.  Neurological: Micheal Ford is alert.  Skin: No abrasion, no petechiae and no rash noted. Rash is not papular, not vesicular  and not urticarial. No erythema. No pallor.  Psychiatric: Micheal Ford has a normal mood and affect.     Diagnostic studies:    Spirometry: results normal (FEV1: 3.01/75%, FVC: 4.34/88%, FEV1/FVC: 69%).    Spirometry consistent with normal pattern.   Allergy Studies: none       Malachi Bonds, MD  [facility]

## 2019-07-09 ENCOUNTER — Ambulatory Visit (INDEPENDENT_AMBULATORY_CARE_PROVIDER_SITE_OTHER): Payer: 59 | Admitting: Psychology

## 2019-07-09 DIAGNOSIS — F410 Panic disorder [episodic paroxysmal anxiety] without agoraphobia: Secondary | ICD-10-CM | POA: Diagnosis not present

## 2019-07-23 ENCOUNTER — Ambulatory Visit (INDEPENDENT_AMBULATORY_CARE_PROVIDER_SITE_OTHER): Payer: 59 | Admitting: Psychology

## 2019-07-23 DIAGNOSIS — F410 Panic disorder [episodic paroxysmal anxiety] without agoraphobia: Secondary | ICD-10-CM | POA: Diagnosis not present

## 2019-07-25 ENCOUNTER — Ambulatory Visit (INDEPENDENT_AMBULATORY_CARE_PROVIDER_SITE_OTHER): Payer: 59

## 2019-07-25 DIAGNOSIS — J309 Allergic rhinitis, unspecified: Secondary | ICD-10-CM

## 2019-08-01 ENCOUNTER — Other Ambulatory Visit: Payer: Self-pay

## 2019-08-01 ENCOUNTER — Encounter: Payer: Self-pay | Admitting: Family Medicine

## 2019-08-01 ENCOUNTER — Ambulatory Visit (INDEPENDENT_AMBULATORY_CARE_PROVIDER_SITE_OTHER): Payer: 59 | Admitting: Family Medicine

## 2019-08-01 VITALS — BP 120/80 | HR 88 | Temp 97.9°F | Ht 67.75 in | Wt 194.0 lb

## 2019-08-01 DIAGNOSIS — H01001 Unspecified blepharitis right upper eyelid: Secondary | ICD-10-CM | POA: Diagnosis not present

## 2019-08-01 MED ORDER — AMOXICILLIN-POT CLAVULANATE 875-125 MG PO TABS: 1.0000 | ORAL_TABLET | Freq: Two times a day (BID) | ORAL | 0 refills | Status: AC

## 2019-08-01 NOTE — Progress Notes (Signed)
Micheal Apolinar T. Leveda Kendrix, MD Primary Care and Sports Medicine [facility] at [facility] [address] [place], [address] Phone: [phone]  FAX: [phone]  CRUZE ZINGARO - 34 y.o. male  MRN [id number]  Date of Birth: 07-23-1985  Visit Date: 08/01/2019  PCP: [name], MD  Referred by: [name], MD  Chief Complaint  Patient presents with  . [name]    This visit occurred during the SARS-CoV-2 public health emergency.  Safety protocols were in place, including screening questions prior to the visit, additional usage of staff PPE, and extensive cleaning of exam room while observing appropriate contact time as indicated for disinfecting solutions.   Subjective:   [name] is a 34 y.o. very pleasant male patient who presents with the following:  Reddish R eyelid, started Tuesday. This is not at the [facility] but rather it is in the upper eyelid isolated. Does have some tenderness to palpation, but it does not have any crusting we will do now or in the morning. This is been present for approximately 2 days. He is able to move his eye in all directions without any difficulty. Vision is normal.  No pink eye    Review of Systems is noted in the HPI, as appropriate  Objective:   BP 120/80   Pulse 88   Temp 97.9 F (36.6 C) (Temporal)   Ht 5' 7.75" (1.721 m)   Wt 194 lb (88 kg)   SpO2 97%   BMI 29.72 kg/m   GEN: No acute distress; alert,appropriate. PULM: Breathing comfortably in no respiratory distress PSYCH: Normally interactive.   Right eye with upper lid topical redness and pain to palpation. Pupils equal and round and reactive to light and accommodation. Extraocular movements are intact. With the exception of the upper eyelid exam is nontender. No blurring of vision. Site is clear. No surrounding redness in the periocular area  Laboratory and Imaging Data:  Assessment and Plan:     ICD-10-CM   1. Blepharitis of  right upper eyelid, unspecified type  H01.001    This appears to be upper eyelid infection. I told him to watch this to make sure that it did not spread and become a periorbital or orbital cellulitis. He understands this and if it does continue to get worse, he will need to obtain some additional medical care  Follow-up: No follow-ups on file.  Meds ordered this encounter  Medications  . amoxicillin-clavulanate (AUGMENTIN) 875-125 MG tablet    Sig: Take 1 tablet by mouth 2 (two) times daily for 7 days.    Dispense:  14 tablet    Refill:  0   Medications Discontinued During This Encounter  Medication Reason  . budesonide (PULMICORT) 0.5 MG/2ML nebulizer solution Completed Course   No orders of the defined types were placed in this encounter.   Signed,  Elpidio Galea. Kendal Raffo, MD   Outpatient Encounter Medications as of 08/01/2019  Medication Sig  . ADVAIR HFA 115-21 MCG/ACT inhaler TAKE 2 PUFFS BY MOUTH TWICE A DAY  . albuterol (PROAIR HFA) 108 (90 Base) MCG/ACT inhaler INHALE 2 PUFFS 4 TIMES A DAY AS NEEDED FOR WHEEZING  . busPIRone (BUSPAR) 10 MG tablet TAKE 1 TABLET BY MOUTH TWICE A DAY  . fluticasone (FLONASE) 50 MCG/ACT nasal spray PLACE 2 SPRAYS INTO THE NOSE DAILY.  Marland Kitchen amoxicillin-clavulanate (AUGMENTIN) 875-125 MG tablet Take 1 tablet by mouth 2 (two) times daily for 7 days.  . [DISCONTINUED] budesonide (PULMICORT) 0.5  MG/2ML nebulizer solution USE 1 VIAL VIA NEBULIZER EVERY DAY (Patient not taking: Reported on 07/04/2019)   No facility-administered encounter medications on file as of 08/01/2019.

## 2019-08-06 ENCOUNTER — Ambulatory Visit (INDEPENDENT_AMBULATORY_CARE_PROVIDER_SITE_OTHER): Payer: 59 | Admitting: Psychology

## 2019-08-06 DIAGNOSIS — F410 Panic disorder [episodic paroxysmal anxiety] without agoraphobia: Secondary | ICD-10-CM | POA: Diagnosis not present

## 2019-08-09 ENCOUNTER — Ambulatory Visit (INDEPENDENT_AMBULATORY_CARE_PROVIDER_SITE_OTHER): Payer: 59

## 2019-08-09 DIAGNOSIS — J309 Allergic rhinitis, unspecified: Secondary | ICD-10-CM

## 2019-08-15 ENCOUNTER — Ambulatory Visit: Payer: 59 | Attending: Internal Medicine

## 2019-08-15 DIAGNOSIS — Z23 Encounter for immunization: Secondary | ICD-10-CM

## 2019-08-15 NOTE — Progress Notes (Signed)
   Covid-19 Vaccination Clinic  Name:  Micheal Ford    MRN: [id number] DOB: May 07, 1986  08/15/2019  Mr. Lady was observed post Covid-19 immunization for 15 minutes without incident. He was provided with Vaccine Information Sheet and instruction to access the V-Safe system.   [name] was instructed to call 911 with any severe reactions post vaccine: Marland Kitchen Difficulty breathing  . Swelling of face and throat  . A fast heartbeat  . A bad rash all over body  . Dizziness and weakness   Immunizations Administered    Name Date Dose VIS Date Route   Pfizer COVID-19 Vaccine 08/15/2019 10:33 AM 0.3 mL 05/03/2019 Intramuscular   Manufacturer: [employer], [employer]   Lot: [id number]   NDC: [address]-3

## 2019-08-20 ENCOUNTER — Ambulatory Visit: Payer: 59 | Admitting: Psychology

## 2019-08-21 ENCOUNTER — Ambulatory Visit (INDEPENDENT_AMBULATORY_CARE_PROVIDER_SITE_OTHER): Payer: 59

## 2019-08-21 DIAGNOSIS — J309 Allergic rhinitis, unspecified: Secondary | ICD-10-CM | POA: Diagnosis not present

## 2019-08-29 ENCOUNTER — Ambulatory Visit (INDEPENDENT_AMBULATORY_CARE_PROVIDER_SITE_OTHER): Payer: 59

## 2019-08-29 DIAGNOSIS — J309 Allergic rhinitis, unspecified: Secondary | ICD-10-CM | POA: Diagnosis not present

## 2019-09-03 ENCOUNTER — Ambulatory Visit: Payer: 59 | Admitting: Psychology

## 2019-09-10 ENCOUNTER — Ambulatory Visit: Payer: 59 | Attending: Internal Medicine

## 2019-09-10 DIAGNOSIS — Z23 Encounter for immunization: Secondary | ICD-10-CM

## 2019-09-10 NOTE — Progress Notes (Signed)
   Covid-19 Vaccination Clinic  Name:  KAIRYN OLMEDA    MRN: [id number] DOB: May 11, 1986  09/10/2019  Mr. Ritchie was observed post Covid-19 immunization for 15 minutes without incident. He was provided with Vaccine Information Sheet and instruction to access the V-Safe system.   Mr. Dayal was instructed to call 911 with any severe reactions post vaccine: Marland Kitchen Difficulty breathing  . Swelling of face and throat  . A fast heartbeat  . A bad rash all over body  . Dizziness and weakness   Immunizations Administered    Name Date Dose VIS Date Route   Pfizer COVID-19 Vaccine 09/10/2019 10:15 AM 0.3 mL 07/17/2018 Intramuscular   Manufacturer: [employer], [employer]   Lot: [id number]   NDC: [address]-4

## 2019-09-17 ENCOUNTER — Ambulatory Visit (INDEPENDENT_AMBULATORY_CARE_PROVIDER_SITE_OTHER): Payer: 59

## 2019-09-17 ENCOUNTER — Ambulatory Visit (INDEPENDENT_AMBULATORY_CARE_PROVIDER_SITE_OTHER): Payer: 59 | Admitting: Psychology

## 2019-09-17 DIAGNOSIS — F410 Panic disorder [episodic paroxysmal anxiety] without agoraphobia: Secondary | ICD-10-CM

## 2019-09-17 DIAGNOSIS — J309 Allergic rhinitis, unspecified: Secondary | ICD-10-CM

## 2019-10-01 ENCOUNTER — Ambulatory Visit (INDEPENDENT_AMBULATORY_CARE_PROVIDER_SITE_OTHER): Payer: 59 | Admitting: Psychology

## 2019-10-01 DIAGNOSIS — F410 Panic disorder [episodic paroxysmal anxiety] without agoraphobia: Secondary | ICD-10-CM | POA: Diagnosis not present

## 2019-10-15 ENCOUNTER — Ambulatory Visit (INDEPENDENT_AMBULATORY_CARE_PROVIDER_SITE_OTHER): Payer: 59 | Admitting: Psychology

## 2019-10-15 DIAGNOSIS — F410 Panic disorder [episodic paroxysmal anxiety] without agoraphobia: Secondary | ICD-10-CM | POA: Diagnosis not present

## 2019-10-18 ENCOUNTER — Ambulatory Visit (INDEPENDENT_AMBULATORY_CARE_PROVIDER_SITE_OTHER): Payer: 59

## 2019-10-18 DIAGNOSIS — J309 Allergic rhinitis, unspecified: Secondary | ICD-10-CM | POA: Diagnosis not present

## 2019-10-29 ENCOUNTER — Ambulatory Visit (INDEPENDENT_AMBULATORY_CARE_PROVIDER_SITE_OTHER): Payer: 59 | Admitting: Psychology

## 2019-10-29 DIAGNOSIS — F410 Panic disorder [episodic paroxysmal anxiety] without agoraphobia: Secondary | ICD-10-CM | POA: Diagnosis not present

## 2019-11-11 NOTE — Progress Notes (Signed)
Micheal Boateng T. Jenet Durio, MD, CAQ Sports Medicine  Primary Care and Sports Medicine [facility] at [facility] [address] [place], [address]  Phone: [phone]  FAX: [phone]  Micheal Ford - 34 y.o. male  MRN [id number]  Date of Birth: 11/27/1985  Date: 11/12/2019  PCP: [name], MD  Referral: [name], MD  Chief Complaint  Patient presents with  . Rash    Under Left Arm    This visit occurred during the SARS-CoV-2 public health emergency.  Safety protocols were in place, including screening questions prior to the visit, additional usage of staff PPE, and extensive cleaning of exam room while observing appropriate contact time as indicated for disinfecting solutions.   Subjective:   Micheal Ford is a 34 y.o. very pleasant male patient with Body mass index is 30.6 kg/m. who presents with the following:  He is a well-known young man who presents with some skin irritation.  He has had a flat rash in the axilla on the left for several days, and subsequently he has developed 2 boils inferior to this.  The axilla is quite itchy, now he is having some pain at the boil.  He is not tried anything thus far to see if it would make it better.  L axilla  Review of Systems is noted in the HPI, as appropriate  Objective:   BP 110/80   Pulse 83   Temp (!) 96.5 F (35.8 C) (Temporal)   Ht 5' 7.75" (1.721 m)   Wt 199 lb 12 oz (90.6 kg)   SpO2 97%   BMI 30.60 kg/m   GEN: No acute distress; alert,appropriate. PULM: Breathing comfortably in no respiratory distress PSYCH: Normally interactive.        Laboratory and Imaging Data:  Assessment and Plan:     ICD-10-CM   1. Candida infection  B37.9   2. Skin irritation  R23.8   3. Boil  L02.92    Axillary Candida and now with secondary boil cutaneous bacterial infection.  Treat as below.  Caution given in case his small boil/abscess worsens.  Call in 1 week if not  improvement.  Follow-up: No follow-ups on file.  Meds ordered this encounter  Medications  . doxycycline (VIBRA-TABS) 100 MG tablet    Sig: Take 1 tablet (100 mg total) by mouth 2 (two) times daily for 10 days.    Dispense:  20 tablet    Refill:  0  . nystatin cream (MYCOSTATIN)    Sig: Apply 1 application topically 2 (two) times daily.    Dispense:  30 g    Refill:  1   There are no discontinued medications. No orders of the defined types were placed in this encounter.   Signed,  Elpidio Galea. Sharicka Pogorzelski, MD   Outpatient Encounter Medications as of 11/12/2019  Medication Sig  . ADVAIR HFA 115-21 MCG/ACT inhaler TAKE 2 PUFFS BY MOUTH TWICE A DAY  . albuterol (PROAIR HFA) 108 (90 Base) MCG/ACT inhaler INHALE 2 PUFFS 4 TIMES A DAY AS NEEDED FOR WHEEZING  . busPIRone (BUSPAR) 10 MG tablet TAKE 1 TABLET BY MOUTH TWICE A DAY  . fluticasone (FLONASE) 50 MCG/ACT nasal spray PLACE 2 SPRAYS INTO THE NOSE DAILY.  . montelukast (SINGULAIR) 10 MG tablet Take 10 mg by mouth daily as needed.  . doxycycline (VIBRA-TABS) 100 MG tablet Take 1 tablet (100 mg total) by mouth 2 (two) times daily for 10 days.  Marland Kitchen nystatin cream (MYCOSTATIN)  Apply 1 application topically 2 (two) times daily.   No facility-administered encounter medications on file as of 11/12/2019.

## 2019-11-12 ENCOUNTER — Ambulatory Visit (INDEPENDENT_AMBULATORY_CARE_PROVIDER_SITE_OTHER): Payer: 59 | Admitting: Psychology

## 2019-11-12 ENCOUNTER — Ambulatory Visit (INDEPENDENT_AMBULATORY_CARE_PROVIDER_SITE_OTHER): Payer: 59 | Admitting: Family Medicine

## 2019-11-12 ENCOUNTER — Encounter: Payer: Self-pay | Admitting: Family Medicine

## 2019-11-12 ENCOUNTER — Ambulatory Visit: Payer: 59 | Admitting: Family Medicine

## 2019-11-12 ENCOUNTER — Other Ambulatory Visit: Payer: Self-pay

## 2019-11-12 VITALS — BP 110/80 | HR 83 | Temp 96.5°F | Ht 67.75 in | Wt 199.8 lb

## 2019-11-12 DIAGNOSIS — B379 Candidiasis, unspecified: Secondary | ICD-10-CM

## 2019-11-12 DIAGNOSIS — L0292 Furuncle, unspecified: Secondary | ICD-10-CM | POA: Diagnosis not present

## 2019-11-12 DIAGNOSIS — R238 Other skin changes: Secondary | ICD-10-CM

## 2019-11-12 DIAGNOSIS — F410 Panic disorder [episodic paroxysmal anxiety] without agoraphobia: Secondary | ICD-10-CM | POA: Diagnosis not present

## 2019-11-12 MED ORDER — DOXYCYCLINE HYCLATE 100 MG PO TABS: 100.0000 mg | ORAL_TABLET | Freq: Two times a day (BID) | ORAL | 0 refills | Status: AC

## 2019-11-12 MED ORDER — NYSTATIN 100000 UNIT/GM EX CREA: 1.0000 "application " | TOPICAL_CREAM | Freq: Two times a day (BID) | CUTANEOUS | 1 refills | Status: DC

## 2019-11-26 ENCOUNTER — Ambulatory Visit (INDEPENDENT_AMBULATORY_CARE_PROVIDER_SITE_OTHER): Payer: 59 | Admitting: Psychology

## 2019-11-26 DIAGNOSIS — F410 Panic disorder [episodic paroxysmal anxiety] without agoraphobia: Secondary | ICD-10-CM | POA: Diagnosis not present

## 2019-11-29 ENCOUNTER — Ambulatory Visit (INDEPENDENT_AMBULATORY_CARE_PROVIDER_SITE_OTHER): Payer: 59

## 2019-11-29 DIAGNOSIS — J309 Allergic rhinitis, unspecified: Secondary | ICD-10-CM

## 2019-11-30 ENCOUNTER — Other Ambulatory Visit: Payer: Self-pay | Admitting: Family Medicine

## 2019-12-10 ENCOUNTER — Ambulatory Visit (INDEPENDENT_AMBULATORY_CARE_PROVIDER_SITE_OTHER): Payer: 59 | Admitting: Psychology

## 2019-12-10 DIAGNOSIS — F410 Panic disorder [episodic paroxysmal anxiety] without agoraphobia: Secondary | ICD-10-CM

## 2019-12-23 ENCOUNTER — Ambulatory Visit (INDEPENDENT_AMBULATORY_CARE_PROVIDER_SITE_OTHER): Payer: 59

## 2019-12-23 DIAGNOSIS — J309 Allergic rhinitis, unspecified: Secondary | ICD-10-CM

## 2019-12-24 ENCOUNTER — Ambulatory Visit: Payer: 59 | Admitting: Psychology

## 2020-01-03 ENCOUNTER — Ambulatory Visit (INDEPENDENT_AMBULATORY_CARE_PROVIDER_SITE_OTHER): Payer: 59 | Admitting: *Deleted

## 2020-01-03 DIAGNOSIS — J309 Allergic rhinitis, unspecified: Secondary | ICD-10-CM

## 2020-01-07 ENCOUNTER — Other Ambulatory Visit: Payer: Self-pay

## 2020-01-07 ENCOUNTER — Encounter: Payer: Self-pay | Admitting: Allergy & Immunology

## 2020-01-07 ENCOUNTER — Telehealth (INDEPENDENT_AMBULATORY_CARE_PROVIDER_SITE_OTHER): Payer: 59 | Admitting: Family Medicine

## 2020-01-07 ENCOUNTER — Encounter: Payer: Self-pay | Admitting: Family Medicine

## 2020-01-07 ENCOUNTER — Ambulatory Visit (INDEPENDENT_AMBULATORY_CARE_PROVIDER_SITE_OTHER): Payer: 59 | Admitting: Allergy & Immunology

## 2020-01-07 ENCOUNTER — Ambulatory Visit (INDEPENDENT_AMBULATORY_CARE_PROVIDER_SITE_OTHER): Payer: 59 | Admitting: Psychology

## 2020-01-07 VITALS — BP 124/88 | HR 85 | Resp 18 | Ht 66.75 in | Wt 198.0 lb

## 2020-01-07 DIAGNOSIS — J302 Other seasonal allergic rhinitis: Secondary | ICD-10-CM | POA: Diagnosis not present

## 2020-01-07 DIAGNOSIS — J3089 Other allergic rhinitis: Secondary | ICD-10-CM

## 2020-01-07 DIAGNOSIS — F410 Panic disorder [episodic paroxysmal anxiety] without agoraphobia: Secondary | ICD-10-CM | POA: Diagnosis not present

## 2020-01-07 DIAGNOSIS — J309 Allergic rhinitis, unspecified: Secondary | ICD-10-CM | POA: Diagnosis not present

## 2020-01-07 DIAGNOSIS — J4540 Moderate persistent asthma, uncomplicated: Secondary | ICD-10-CM | POA: Diagnosis not present

## 2020-01-07 MED ORDER — PREDNISONE 20 MG PO TABS
ORAL_TABLET | ORAL | 0 refills | Status: DC
Start: 2020-01-07 — End: 2020-01-15

## 2020-01-07 NOTE — Assessment & Plan Note (Signed)
Due to allergen exposure. Treat with prednisone taper.  Doubt viral or bacterial infection. COVID19 test pending.

## 2020-01-07 NOTE — Patient Instructions (Addendum)
1. Moderate persistent asthma, uncomplicated - Breathing test looked awesome today despite the Cheese Episode of 2021.  - We are not going to make any medication changes at this time.  - Daily controller medication(s): Advair 115/38mcg two puffs twice daily with spacer - Prior to physical activity: albuterol 2 puffs 10-15 minutes before physical activity. - Rescue medications: albuterol 4 puffs every 4-6 hours as needed or albuterol nebulizer one vial every 4-6 hours as needed - Asthma control goals:  * Full participation in all desired activities (may need albuterol before activity) * Albuterol use two time or less a week on average (not counting use with activity) * Cough interfering with sleep two time or less a month * Oral steroids no more than once a year * No hospitalizations  2. Chronic rhinitis (grasses, ragweed, weeds, trees, indoor molds, outdoor molds, dust mites, cat, dog and cockroach) - Continue with allergy shots at the same schedule. - Be sure to take an antihistamine (like Xyzal or Zyrtec) on shot days to prevent any reactions.   - Continue with: Flonase (fluticasone) two sprays per nostril daily and Singulair as needed.   3. Return in about 1 year (around 01/06/2021). This can be an in-person, a virtual Webex or a telephone follow up visit.     Please inform us of any Emergency Department visits, hospitalizations, or changes in symptoms. Call us before going to the ED for breathing or allergy symptoms since we might be able to fit you in for a sick visit. Feel free to contact us anytime with any questions, problems, or concerns.  It was a pleasure to see you again today!  Websites that have reliable patient information: 1. American Academy of Asthma, Allergy, and Immunology: www.aaaai.org 2. Food Allergy Research and Education (FARE): foodallergy.org 3. Mothers of Asthmatics: http://www.asthmacommunitynetwork.org 4. American College of Allergy, Asthma, and Immunology:  www.acaai.org   COVID-19 Vaccine Information can be found at: [email or web address].[email or web address] For questions related to vaccine distribution or appointments, please email [email or web address] or call [phone].     "Like" [place] on Facebook and Instagram for our latest updates!        Make sure you are registered to vote! If you have moved or changed any of your contact information, you will need to get this updated before voting!  In some cases, you MAY be able to register to vote online: [email or web address]
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
## 2020-01-07 NOTE — Patient Instructions (Signed)
Call if positive COVID test to consider monoclonal antibodies. Can use mucinex DM for congestion. Compelte prednsione taper.  Hold flonase until prednisone completed. Can use nasal saline irrigation/spray daily.Marland Kitchen

## 2020-01-07 NOTE — Assessment & Plan Note (Signed)
Stable control at this time on Advair.

## 2020-01-08 MED ORDER — MONTELUKAST SODIUM 10 MG PO TABS: 10.0000 mg | ORAL_TABLET | Freq: Every day | ORAL | 5 refills | Status: DC | PRN

## 2020-01-08 MED ORDER — ADVAIR HFA 115-21 MCG/ACT IN AERO: INHALATION_SPRAY | RESPIRATORY_TRACT | 5 refills | Status: DC

## 2020-01-08 MED ORDER — ALBUTEROL SULFATE HFA 108 (90 BASE) MCG/ACT IN AERS: INHALATION_SPRAY | RESPIRATORY_TRACT | 1 refills | Status: DC

## 2020-01-08 NOTE — Addendum Note (Signed)
Addended by: Deborra Medina on: 01/08/2020 04:55 PM   Modules accepted: Orders

## 2020-01-11 ENCOUNTER — Telehealth: Payer: Self-pay | Admitting: Family Medicine

## 2020-01-13 NOTE — Telephone Encounter (Signed)
Please schedule CPE with fasting labs prior for Dr. Copland.  

## 2020-01-14 NOTE — Progress Notes (Signed)
Micheal Minerva T. Ahmari Garton, MD, CAQ Sports Medicine  Primary Care and Sports Medicine [facility] at [facility] [address] [place], [address]  Phone: [phone]   FAX: [phone]  Micheal Ford - 34 y.o. male   MRN [id number]   Date of Birth: [birth year]  Date: 01/15/2020   PCP: [name], MD   Referral: [name], MD  Chief Complaint  Patient presents with   Follow-up    boil under right arm, seen at [facility] on Monday.    This visit occurred during the SARS-CoV-2 public health emergency.  Safety protocols were in place, including screening questions prior to the visit, additional usage of staff PPE, and extensive cleaning of exam room while observing appropriate contact time as indicated for disinfecting solutions.   Subjective:   Micheal Ford is a 34 y.o. very pleasant male patient with Body mass index is 31.05 kg/m. who presents with the following:  R axillary boil / abscess.  Micheal Ford is here for 2 primary reasons.  He does have a small abscess in the axilla.  This has opened up on its own and drained some.  It does appear to be somewhat better, but it is painful.  He did go to urgent care a few days ago and they placed him on some Bactrim DS.  He does not have any other systemic symptoms such as fever.  R OE: He also has some pain in his right ear and pain with opening and closing the jaw as well as pain with moving the external ear itself.  Review of Systems is noted in the HPI, as appropriate  Objective:   BP (!) 144/76    Pulse (!) 101    Temp (!) 97.4 F (36.3 C) (Temporal)    Ht 5' 6.75" (1.695 m)    Wt 196 lb 12 oz (89.2 kg)    SpO2 97%    BMI 31.05 kg/m   GEN: No acute distress; alert,appropriate. PULM: Breathing comfortably in no respiratory distress PSYCH: Normally interactive.   In the right axilla, the patient does have an area of elevation in induration without fluctuance.  This area is approximately the size of a half dollar.   There is no appreciable warmth.  It is tender to palpation.  The left ear and TM are entirely clear. The TM on the right is clear, but the canal itself is somewhat swollen.  Does have pain with moving the ear in all directions externally.  Laboratory and Imaging Data:  Assessment and Plan:     ICD-10-CM   1. Abscess, axilla  L02.419   2. Acute infective otitis externa, right  H60.391    Continue Bactrim DS.  Hopefully this will improve.  Surgical drainage and change in antibiotics could be needed.  Classic treatment for otitis externa.  Follow-up: No follow-ups on file.  Meds ordered this encounter  Medications   ciprofloxacin-dexamethasone (CIPRODEX) OTIC suspension    Sig: Place 4 drops into the right ear 2 (two) times daily.    Dispense:  7.5 mL    Refill:  0   Medications Discontinued During This Encounter  Medication Reason   predniSONE (DELTASONE) 20 MG tablet Completed Course   nystatin cream (MYCOSTATIN) Completed Course   No orders of the defined types were placed in this encounter.   Signed,  Elpidio Galea. Jorma Tassinari, MD   Outpatient Encounter Medications as of 01/15/2020  Medication Sig   albuterol (PROAIR HFA) 108 (90 Base)  MCG/ACT inhaler INHALE 2 PUFFS 4 TIMES A DAY AS NEEDED FOR WHEEZING   busPIRone (BUSPAR) 10 MG tablet TAKE 1 TABLET BY MOUTH TWICE A DAY   fluticasone (FLONASE) 50 MCG/ACT nasal spray PLACE 2 SPRAYS INTO THE NOSE DAILY.   fluticasone-salmeterol (ADVAIR HFA) 115-21 MCG/ACT inhaler TAKE 2 PUFFS BY MOUTH TWICE A DAY   montelukast (SINGULAIR) 10 MG tablet Take 1 tablet (10 mg total) by mouth daily as needed.   sulfamethoxazole-trimethoprim (BACTRIM DS) 800-160 MG tablet Take 1 tablet by mouth 2 (two) times daily.   ciprofloxacin-dexamethasone (CIPRODEX) OTIC suspension Place 4 drops into the right ear 2 (two) times daily.   [DISCONTINUED] nystatin cream (MYCOSTATIN) Apply 1 application topically 2 (two) times daily. (Patient not taking:  Reported on 01/07/2020)   [DISCONTINUED] predniSONE (DELTASONE) 20 MG tablet 3 tabs by mouth daily x 3 days, then 2 tabs by mouth daily x 2 days then 1 tab by mouth daily x 2 days   No facility-administered encounter medications on file as of 01/15/2020.

## 2020-01-15 ENCOUNTER — Other Ambulatory Visit: Payer: Self-pay

## 2020-01-15 ENCOUNTER — Encounter: Payer: Self-pay | Admitting: Family Medicine

## 2020-01-15 ENCOUNTER — Ambulatory Visit (INDEPENDENT_AMBULATORY_CARE_PROVIDER_SITE_OTHER): Payer: 59 | Admitting: Family Medicine

## 2020-01-15 VITALS — BP 144/76 | HR 101 | Temp 97.4°F | Ht 66.75 in | Wt 196.8 lb

## 2020-01-15 DIAGNOSIS — H60391 Other infective otitis externa, right ear: Secondary | ICD-10-CM

## 2020-01-15 DIAGNOSIS — L02419 Cutaneous abscess of limb, unspecified: Secondary | ICD-10-CM | POA: Diagnosis not present

## 2020-01-15 MED ORDER — CIPROFLOXACIN-DEXAMETHASONE 0.3-0.1 % OT SUSP: 4.0000 [drp] | Freq: Two times a day (BID) | OTIC | 0 refills | Status: DC

## 2020-01-17 NOTE — Telephone Encounter (Signed)
Left message asking pt to call office  °

## 2020-01-21 ENCOUNTER — Ambulatory Visit (INDEPENDENT_AMBULATORY_CARE_PROVIDER_SITE_OTHER): Payer: 59 | Admitting: Psychology

## 2020-01-21 ENCOUNTER — Ambulatory Visit (INDEPENDENT_AMBULATORY_CARE_PROVIDER_SITE_OTHER): Payer: 59

## 2020-01-21 DIAGNOSIS — J309 Allergic rhinitis, unspecified: Secondary | ICD-10-CM

## 2020-01-21 DIAGNOSIS — F410 Panic disorder [episodic paroxysmal anxiety] without agoraphobia: Secondary | ICD-10-CM

## 2020-01-21 DIAGNOSIS — J3089 Other allergic rhinitis: Secondary | ICD-10-CM

## 2020-02-03 ENCOUNTER — Ambulatory Visit: Payer: 59 | Admitting: Family Medicine

## 2020-02-03 NOTE — Progress Notes (Deleted)
    Micheal Rogue T. [name], MD, CAQ Sports Medicine  Primary Care and Sports Medicine [facility] at [facility] [address] [place], [address]  Phone: [phone]  FAX: [phone]  Micheal Ford - 34 y.o. male  MRN [id number]  Date of Birth: 08-May-1986  Date: 02/03/2020  PCP: [name], MD  Referral: [name], MD  No chief complaint on file.   This visit occurred during the SARS-CoV-2 public health emergency.  Safety protocols were in place, including screening questions prior to the visit, additional usage of staff PPE, and extensive cleaning of exam room while observing appropriate contact time as indicated for disinfecting solutions.   Subjective:   Micheal Ford is a 34 y.o. very pleasant male patient with There is no height or weight on file to calculate BMI. who presents with the following:  Micheal Ford presents with constipation.    Review of Systems is noted in the HPI, as appropriate  Objective:   There were no vitals taken for this visit.  GEN: No acute distress; alert,appropriate. PULM: Breathing comfortably in no respiratory distress PSYCH: Normally interactive.   Laboratory and Imaging Data:  Assessment and Plan:   ***

## 2020-02-04 ENCOUNTER — Ambulatory Visit (INDEPENDENT_AMBULATORY_CARE_PROVIDER_SITE_OTHER): Payer: 59 | Admitting: Psychology

## 2020-02-04 DIAGNOSIS — F410 Panic disorder [episodic paroxysmal anxiety] without agoraphobia: Secondary | ICD-10-CM

## 2020-02-05 NOTE — Telephone Encounter (Signed)
Labs 10/4 cpx 10/7

## 2020-02-17 ENCOUNTER — Other Ambulatory Visit: Payer: Self-pay | Admitting: Family Medicine

## 2020-02-17 DIAGNOSIS — Z131 Encounter for screening for diabetes mellitus: Secondary | ICD-10-CM

## 2020-02-17 DIAGNOSIS — Z13220 Encounter for screening for lipoid disorders: Secondary | ICD-10-CM

## 2020-02-17 DIAGNOSIS — Z79899 Other long term (current) drug therapy: Secondary | ICD-10-CM

## 2020-02-18 ENCOUNTER — Ambulatory Visit (INDEPENDENT_AMBULATORY_CARE_PROVIDER_SITE_OTHER): Payer: 59 | Admitting: Psychology

## 2020-02-18 DIAGNOSIS — F410 Panic disorder [episodic paroxysmal anxiety] without agoraphobia: Secondary | ICD-10-CM | POA: Diagnosis not present

## 2020-02-18 NOTE — Progress Notes (Signed)
     Micheal Venson T. Tanazia Achee, MD Primary Care and Sports Medicine [facility] at [facility] [address] [place], [address] Phone: [phone]  FAX: [phone]  [name] - 34 y.o. male  MRN [id number]  Date of Birth: September 15, 1985  Visit Date: 02/19/2020  PCP: [name], MD  Referred by: [name], MD Chief Complaint  Patient presents with  . Cough  . Nasal Congestion  . Sinusitis   Virtual Visit via Video Note:  I connected with  Micheal Ford on 02/19/2020  9:00 AM EDT by a video enabled telemedicine application and verified that I am speaking with the correct person using two identifiers.   Location patient: home computer, tablet, or smartphone Location provider: work or home office Consent: Verbal consent directly obtained from [employer]. Persons participating in the virtual visit: patient, provider  I discussed the limitations of evaluation and management by telemedicine and the availability of in person appointments. The patient expressed understanding and agreed to proceed.  History of Present Illness:  Micheal Ford is a well-known young gentleman.  He presents today with a several day history of some nasal congestion, chest congestion with a productive cough.  He also has some headache, nauseousness as well is a stomachache.  He is also had some diarrhea.  He has no known posters to [employer], and he denies sinus pressure, earache, sore throat.  He denies constipation as well as he denies having any fever.  He is able to drink and eat reasonably normally.  He denies any systemic chills.  He denies is any neurological changes.  Immunization History  Administered Date(s) Administered  . Influenza,inj,Quad PF,6+ Mos 02/26/2018  . PFIZER SARS-COV-2 Vaccination 08/15/2019, 09/10/2019     Review of Systems as above: See pertinent positives and pertinent negatives per HPI No acute distress verbally   Observations/Objective/Exam:  An attempt  was made to discern vital signs over the phone and per patient if applicable and possible.   General:    Alert, Oriented, appears well and in no acute distress  Pulmonary:     On inspection no signs of respiratory distress.  Psych / Neurological:     Pleasant and cooperative.  Assessment and Plan:    ICD-10-CM   1. Cough  R05   2. Chest congestion  R09.89   3. Diarrhea, unspecified type  R19.7   4. Wheezing  R06.2    Multiple symptoms.  Viral syndrome certainly possible, but with COVID-19 spike I did tell him to quarantine and get a COVID-19 negative test before returning to work.  I discussed the assessment and treatment plan with the patient. The patient was provided an opportunity to ask questions and all were answered. The patient agreed with the plan and demonstrated an understanding of the instructions.   The patient was advised to call back or seek an in-person evaluation if the symptoms worsen or if the condition fails to improve as anticipated.  Follow-up: prn unless noted otherwise below No follow-ups on file.  Meds ordered this encounter  Medications  . predniSONE (DELTASONE) 20 MG tablet    Sig: 2 tabs po daily for 5 days, then 1 tab po daily for 5 days    Dispense:  15 tablet    Refill:  0   No orders of the defined types were placed in this encounter.   Signed,  Elpidio Galea. Krisa Blattner, MD

## 2020-02-19 ENCOUNTER — Other Ambulatory Visit: Payer: Self-pay

## 2020-02-19 ENCOUNTER — Telehealth (INDEPENDENT_AMBULATORY_CARE_PROVIDER_SITE_OTHER): Payer: 59 | Admitting: Family Medicine

## 2020-02-19 ENCOUNTER — Encounter: Payer: Self-pay | Admitting: Family Medicine

## 2020-02-19 VITALS — Ht 66.75 in | Wt 190.0 lb

## 2020-02-19 DIAGNOSIS — R059 Cough, unspecified: Secondary | ICD-10-CM

## 2020-02-19 DIAGNOSIS — R05 Cough: Secondary | ICD-10-CM

## 2020-02-19 DIAGNOSIS — R0989 Other specified symptoms and signs involving the circulatory and respiratory systems: Secondary | ICD-10-CM | POA: Diagnosis not present

## 2020-02-19 DIAGNOSIS — R062 Wheezing: Secondary | ICD-10-CM | POA: Diagnosis not present

## 2020-02-19 DIAGNOSIS — R197 Diarrhea, unspecified: Secondary | ICD-10-CM | POA: Diagnosis not present

## 2020-02-19 MED ORDER — PREDNISONE 20 MG PO TABS: ORAL_TABLET | ORAL | 0 refills | Status: DC

## 2020-02-24 ENCOUNTER — Other Ambulatory Visit (INDEPENDENT_AMBULATORY_CARE_PROVIDER_SITE_OTHER): Payer: 59

## 2020-02-24 ENCOUNTER — Other Ambulatory Visit: Payer: Self-pay

## 2020-02-24 DIAGNOSIS — Z131 Encounter for screening for diabetes mellitus: Secondary | ICD-10-CM

## 2020-02-24 DIAGNOSIS — Z13220 Encounter for screening for lipoid disorders: Secondary | ICD-10-CM

## 2020-02-24 DIAGNOSIS — Z79899 Other long term (current) drug therapy: Secondary | ICD-10-CM

## 2020-02-25 LAB — HEPATIC FUNCTION PANEL
ALT: 34 U/L (ref 0–53)
AST: 15 U/L (ref 0–37)
Albumin: 4.7 g/dL (ref 3.5–5.2)
Alkaline Phosphatase: 56 U/L (ref 39–117)
Bilirubin, Direct: 0.2 mg/dL (ref 0.0–0.3)
Total Bilirubin: 1.4 mg/dL — ABNORMAL HIGH (ref 0.2–1.2)
Total Protein: 7.3 g/dL (ref 6.0–8.3)

## 2020-02-25 LAB — CBC WITH DIFFERENTIAL/PLATELET
Basophils Absolute: 0.1 10*3/uL (ref 0.0–0.1)
Basophils Relative: 0.7 % (ref 0.0–3.0)
Eosinophils Absolute: 0 10*3/uL (ref 0.0–0.7)
Eosinophils Relative: 0.2 % (ref 0.0–5.0)
HCT: 47.3 % (ref 39.0–52.0)
Hemoglobin: 15.5 g/dL (ref 13.0–17.0)
Lymphocytes Relative: 12.5 % (ref 12.0–46.0)
Lymphs Abs: 1.1 10*3/uL (ref 0.7–4.0)
MCHC: 32.7 g/dL (ref 30.0–36.0)
MCV: 87.4 fl (ref 78.0–100.0)
Monocytes Absolute: 0.3 10*3/uL (ref 0.1–1.0)
Monocytes Relative: 2.9 % — ABNORMAL LOW (ref 3.0–12.0)
Neutro Abs: 7.4 10*3/uL (ref 1.4–7.7)
Neutrophils Relative %: 83.7 % — ABNORMAL HIGH (ref 43.0–77.0)
Platelets: 281 10*3/uL (ref 150.0–400.0)
RBC: 5.41 Mil/uL (ref 4.22–5.81)
RDW: 13.9 % (ref 11.5–15.5)
WBC: 8.8 10*3/uL (ref 4.0–10.5)

## 2020-02-25 LAB — BASIC METABOLIC PANEL
BUN: 13 mg/dL (ref 6–23)
CO2: 29 mEq/L (ref 19–32)
Calcium: 9.4 mg/dL (ref 8.4–10.5)
Chloride: 101 mEq/L (ref 96–112)
Creatinine, Ser: 1.25 mg/dL (ref 0.40–1.50)
GFR: 75.09 mL/min (ref >60.00)
Glucose, Bld: 154 mg/dL — ABNORMAL HIGH (ref 70–99)
Potassium: 4 mEq/L (ref 3.5–5.1)
Sodium: 138 mEq/L (ref 135–145)

## 2020-02-25 LAB — LIPID PANEL
Cholesterol: 212 mg/dL — ABNORMAL HIGH (ref 0–200)
HDL: 38.9 mg/dL — ABNORMAL LOW (ref >39.00)
LDL Cholesterol: 153 mg/dL — ABNORMAL HIGH (ref 0–99)
NonHDL: 173.08
Total CHOL/HDL Ratio: 5
Triglycerides: 102 mg/dL (ref 0.0–149.0)
VLDL: 20.4 mg/dL (ref 0.0–40.0)

## 2020-02-25 LAB — HEMOGLOBIN A1C: Hgb A1c MFr Bld: 5.7 % (ref 4.6–6.5)

## 2020-02-27 ENCOUNTER — Encounter: Payer: Self-pay | Admitting: Family Medicine

## 2020-02-27 ENCOUNTER — Ambulatory Visit (INDEPENDENT_AMBULATORY_CARE_PROVIDER_SITE_OTHER): Payer: 59 | Admitting: Family Medicine

## 2020-02-27 ENCOUNTER — Other Ambulatory Visit: Payer: Self-pay

## 2020-02-27 ENCOUNTER — Encounter: Payer: 59 | Admitting: Family Medicine

## 2020-02-27 ENCOUNTER — Ambulatory Visit: Payer: 59 | Admitting: Family Medicine

## 2020-02-27 VITALS — BP 150/96 | HR 108 | Ht 66.75 in | Wt 196.6 lb

## 2020-02-27 DIAGNOSIS — M546 Pain in thoracic spine: Secondary | ICD-10-CM | POA: Diagnosis not present

## 2020-02-27 DIAGNOSIS — M542 Cervicalgia: Secondary | ICD-10-CM

## 2020-02-27 MED ORDER — TIZANIDINE HCL 4 MG PO TABS: 4.0000 mg | ORAL_TABLET | Freq: Three times a day (TID) | ORAL | 1 refills | Status: DC | PRN

## 2020-02-27 NOTE — Progress Notes (Signed)
   I, Micheal Ford, LAT, ATC, am serving as scribe for Dr. [name].  Micheal Ford is a 34 y.o. male who presents to [facility] at [facility] today for upper back pain.  He was last seen by Dr. Patsy Lager on 02/19/20 for primary care concerns of cough and congestion.  He locates his pain to mid back that radiates proximally into his neck and into his back of his head.  He states that this started feeling pain last Thursday but notes his initial MOI occurred about 3 weeks ago when he slipped and fell on his back at work.  Radiating pain: yes into the neck, post head and B UT UE symptoms: No Aggravating factors: leaning to the side; cervical and thoracic flexion/extension Treatments tried: IBU; heat   Pertinent review of systems: No fevers or chills  Relevant historical information: Allergies and asthma.  GAD.  Patient works as a Financial risk analyst.   Exam:  BP (!) 150/96 (BP Location: Right Arm, Patient Position: Sitting, Cuff Size: Normal)   Pulse (!) 108   Ht 5' 6.75" (1.695 m)   Wt 196 lb 9.6 oz (89.2 kg)   SpO2 98%   BMI 31.02 kg/m  General: Well Developed, well nourished, and in no acute distress.   MSK: C-spine normal-appearing nontender midline.  Tender palpation cervical paraspinal musculature bilaterally.  Normal cervical motion.  Upper semistrength reflexes and sensation are equal normal throughout bilaterally. T-spine normal-appearing nontender midline.  Tender palpation bilateral rhomboid region. Normal scapular motion and strength.     Assessment and Plan: 34 y.o. male with thoracic and cervical pain ongoing for about a week.  Patient fell 3 weeks ago but did not have much pain until a week ago.  He does not have point tenderness along spinal midline and has good strength and reasonable motion.  Doubtful for fracture.  Discussed options.  Plan for heating pad TENS unit and physical therapy and limited tizanidine.  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  Reasonable to follow-up this issue that is still needed.  Otherwise recheck back with me or PCP sooner.   PDMP not reviewed this encounter. Orders Placed This Encounter  Procedures  . Ambulatory referral to Physical Therapy    Referral Priority:   Routine    Referral Type:   Physical Medicine    Referral Reason:   Specialty Services Required    Requested Specialty:   Physical Therapy   Meds ordered this encounter  Medications  . tiZANidine (ZANAFLEX) 4 MG tablet    Sig: Take 1 tablet (4 mg total) by mouth every 8 (eight) hours as needed for muscle spasms.    Dispense:  30 tablet    Refill:  1     Discussed warning signs or symptoms. Please see discharge instructions. Patient expresses understanding.   The above documentation has been reviewed and is accurate and complete [name], M.D.

## 2020-02-27 NOTE — Progress Notes (Deleted)
I, [name], LAT, ATC, am serving as scribe for Dr. [name].  Micheal Ford is a 34 y.o. male who presents to [facility] at [facility] today for new issue of upper/mid-back pain.  He was last seen by Dr. Patsy Lager for primary care concerns of cough and congestion on 02/19/20.  He locates his pain to   Radiating pain: UE numbness/tingling: UE weakness: Aggravating factors: Treatments tried:   Pertinent review of systems: ***  Relevant historical information: ***   Exam:  There were no vitals taken for this visit. General: Well Developed, well nourished, and in no acute distress.   MSK: ***    Lab and Radiology Results Results for orders placed or performed in visit on 02/24/20 (from the past 72 hour(s))  Hemoglobin A1c     Status: None   Collection Time: 02/24/20  3:03 PM  Result Value Ref Range   Hgb A1c MFr Bld 5.7 4.6 - 6.5 %    Comment: Glycemic Control Guidelines for People with Diabetes:Non Diabetic:  <6%Goal of Therapy: <7%Additional Action Suggested:  >8%   Basic metabolic panel     Status: Abnormal   Collection Time: 02/24/20  3:03 PM  Result Value Ref Range   Sodium 138 135 - 145 mEq/L   Potassium 4.0 3.5 - 5.1 mEq/L   Chloride 101 96 - 112 mEq/L   CO2 29 19 - 32 mEq/L   Glucose, Bld 154 (H) 70 - 99 mg/dL   BUN 13 6 - 23 mg/dL   Creatinine, Ser 3.89 0.40 - 1.50 mg/dL   GFR 37.34 >28.76 mL/min   Calcium 9.4 8.4 - 10.5 mg/dL  Hepatic function panel     Status: Abnormal   Collection Time: 02/24/20  3:03 PM  Result Value Ref Range   Total Bilirubin 1.4 (H) 0.2 - 1.2 mg/dL   Bilirubin, Direct 0.2 0.0 - 0.3 mg/dL   Alkaline Phosphatase 56 39 - 117 U/L   AST 15 0 - 37 U/L   ALT 34 0 - 53 U/L   Total Protein 7.3 6.0 - 8.3 g/dL   Albumin 4.7 3.5 - 5.2 g/dL  CBC with Differential/Platelet     Status: Abnormal   Collection Time: 02/24/20  3:03 PM  Result Value Ref Range   WBC 8.8 4.0 - 10.5 K/uL   RBC 5.41 4.22 - 5.81 Mil/uL    Hemoglobin 15.5 13.0 - 17.0 g/dL   HCT 81.1 39 - 52 %   MCV 87.4 78.0 - 100.0 fl   MCHC 32.7 30.0 - 36.0 g/dL   RDW 57.2 62.0 - 35.5 %   Platelets 281.0 150 - 400 K/uL   Neutrophils Relative % 83.7 (H) 43 - 77 %   Lymphocytes Relative 12.5 12 - 46 %   Monocytes Relative 2.9 (L) 3 - 12 %   Eosinophils Relative 0.2 0 - 5 %   Basophils Relative 0.7 0 - 3 %   Neutro Abs 7.4 1.4 - 7.7 K/uL   Lymphs Abs 1.1 0.7 - 4.0 K/uL   Monocytes Absolute 0.3 0 - 1 K/uL   Eosinophils Absolute 0.0 0 - 0 K/uL   Basophils Absolute 0.1 0 - 0 K/uL  Lipid panel     Status: Abnormal   Collection Time: 02/24/20  3:03 PM  Result Value Ref Range   Cholesterol 212 (H) 0 - 200 mg/dL    Comment: ATP III Classification       Desirable:  < 200 mg/dL  Borderline High:  200 - 239 mg/dL          High:  > = 588 mg/dL   Triglycerides 502.7 0 - 149 mg/dL    Comment: Normal:  <741 mg/dLBorderline High:  150 - 199 mg/dL   HDL 28.78 (L) >67.67 mg/dL   VLDL 20.9 0.0 - 47.0 mg/dL   LDL Cholesterol 962 (H) 0 - 99 mg/dL   Total CHOL/HDL Ratio 5     Comment:                Men          Women1/2 Average Risk     3.4          3.3Average Risk          5.0          4.42X Average Risk          9.6          7.13X Average Risk          15.0          11.0                       NonHDL 173.08     Comment: NOTE:  Non-HDL goal should be 30 mg/dL higher than patient's LDL goal (i.e. LDL goal of < 70 mg/dL, would have non-HDL goal of < 100 mg/dL)   No results found.     Assessment and Plan: 34 y.o. male with ***   PDMP not reviewed this encounter. No orders of the defined types were placed in this encounter.  No orders of the defined types were placed in this encounter.    Discussed warning signs or symptoms. Please see discharge instructions. Patient expresses understanding.   ***

## 2020-02-27 NOTE — Patient Instructions (Addendum)
Thank you for coming in today.  I've referred you to Physical Therapy.  Let us know if you don't hear from them in one week.  Try heating pad.   Use the muscle relaxer up to 3x daily for pain and spasm. It may make you sleepy.    You are scheduled with Dr Patsy Lager on Nov 3rd for a physical. If this is not better that is already scheduled.   Recheck with Dr Patsy Lager or me as needed.   TENS UNIT: This is helpful for muscle pain and spasm.   Search and Purchase a TENS 7000 2nd edition at  www.tenspros.com or www.Amazon.com It should be less than $30.     TENS unit instructions: Do not shower or bathe with the unit on . Turn the unit off before removing electrodes or batteries . If the electrodes lose stickiness add a drop of water to the electrodes after they are disconnected from the unit and place on plastic sheet. If you continued to have difficulty, call the TENS unit company to purchase more electrodes. . Do not apply lotion on the skin area prior to use. Make sure the skin is clean and dry as this will help prolong the life of the electrodes. . After use, always check skin for unusual red areas, rash or other skin difficulties. If there are any skin problems, does not apply electrodes to the same area. . Never remove the electrodes from the unit by pulling the wires. . Do not use the TENS unit or electrodes other than as directed. . Do not change electrode placement without consultating your therapist or physician. Marland Kitchen Keep 2 fingers with between each electrode. . Wear time ratio is 2:1, on to off times.    For example on for 30 minutes off for 15 minutes and then on for 30 minutes off for 15 minutes

## 2020-03-03 ENCOUNTER — Ambulatory Visit: Payer: 59 | Admitting: Psychology

## 2020-03-06 ENCOUNTER — Telehealth: Payer: Self-pay

## 2020-03-06 NOTE — Telephone Encounter (Signed)
Pt called stating that he was calling back a nurse he spoke to earlier... pt did talk to nurse line earlier in reference to abd pain, right side back pain, nausea also diarrhea and intermittent dark stools...Marland Kitchenx 3 days and denies fever/chills... pt transferred back to nurse line

## 2020-03-06 NOTE — Telephone Encounter (Signed)
[facility] Day - Client TELEPHONE ADVICE RECORD [facility] Patient Name: Micheal Ford Gender: Male DOB: 1985/09/11 Age: 34 Y 2 M 6 D Return Phone Number: [phone] (Primary) Address: City/State/Zip: [place] [address] Client [facility] Day - Client Client Site [facility] - Day Physician Copland, Karleen Hampshire - MD Contact Type Call Who Is Calling Patient / Member / Family / Caregiver Call Type Triage / Clinical Relationship To Patient Self Return Phone Number [phone] (Primary) Chief Complaint Abdominal Pain Reason for Call Symptomatic / Request for Health Information Initial Comment Caller states he is having abdominal pain. He is also having severe nausea. He has had small BM but not a full BM since Monday. He feels like he needs to throw up but can not. He is also having pain on the bottom right side of his back. Translation No Nurse Assessment Nurse: Odis Luster, RN, Bjorn Loser Date/Time (Eastern Time): 03/06/2020 10:23:44 AM Confirm and document reason for call. If symptomatic, describe symptoms. ---Caller states he is having abdominal pain. He is also having severe nausea. He has had small BM but not a full BM since Monday. He feels like he needs to throw up but can not. He is also having pain on the bottom right side of his back. Does the patient have any new or worsening symptoms? ---Yes Will a triage be completed? ---Yes Related visit to physician within the last 2 weeks? ---Yes Does the PT have any chronic conditions? (i.e. diabetes, asthma, this includes High risk factors for pregnancy, etc.) ---Yes List chronic conditions. ---asthma Is this a behavioral health or substance abuse call? ---No Guidelines Guideline Title Affirmed Question Affirmed Notes Nurse Date/Time (Eastern Time) Abdominal Pain - Male [1] MODERATE pain (e.g., interferes with normal activities) AND [2] pain comes and  goes (cramps) AND [3] present > 24 hours (Exception: pain with Vomiting or Diarrhea - see that Guideline) Odis Luster, RN, Rhonda 03/06/2020 10:25:41 AM PLEASE NOTE: All timestamps contained within this report are represented as [place] Standard Time. CONFIDENTIALTY NOTICE: This fax transmission is intended only for the addressee. It contains information that is legally privileged, confidential or otherwise protected from use or disclosure. If you are not the intended recipient, you are strictly prohibited from reviewing, disclosing, copying using or disseminating any of this information or taking any action in reliance on or regarding this information. If you have received this fax in error, please notify us immediately by telephone so that we can arrange for its return to [place]. Phone: [phone], Toll-Free: [phone], Fax: [phone] Page: 2 of 2 Call Id: [id number] Disp. Time Lamount Cohen Time) Disposition Final User 03/06/2020 10:31:08 AM See PCP within 24 Hours Yes Odis Luster, RN, Juliene Pina Disagree/Comply Comply Caller Understands Yes PreDisposition Call Doctor Care Advice Given Per Guideline SEE PCP WITHIN 24 HOURS: * IF OFFICE WILL BE OPEN: You need to be examined within the next 24 hours. Call your doctor (or NP/PA) when the office opens and make an appointment. CRAMPS: * Your cramps may be due to an intestinal virus or from something that you ate. * During cramps, drink some water, then lie down and try to find a comfortable position. * Avoid greasy or fatty foods. * Avoid alcohol or caffeinated beverages. DIET: * Drink adequate fluids. Eat a bland diet. CALL BACK IF: * Severe pain lasts over 1 hour * Constant pain lasts over 2 hours * You become worse CARE ADVICE given per Abdominal Pain, Male (Adult) guideline. Referrals REFERRED  TO PCP OFFICE

## 2020-03-06 NOTE — Telephone Encounter (Signed)
Unable to reach pt by phone;I spoke with pt's mom; pts phone is not working and pt had used his mom's phoneearlier; they are not together now and pts mom will let pt know there are no available appts at [facility] today and pt can go to [facility] or ED for evaluation.  pts mom voiced understanding and will let pt know. Pt will call our office if any further questions or concerns. FYI to Dr Patsy Lager.

## 2020-03-06 NOTE — Telephone Encounter (Signed)
[facility] Day - Client TELEPHONE ADVICE RECORD [facility] Patient Name: Micheal Ford Gender: Male DOB: Jun 17, 1985 Age: 34 Y 2 M 6 D Return Phone Number: [phone] (Primary), [phone] (Secondary) Address: City/State/Zip: [place] [address] Client [facility] Day - Client Client Site [facility] - Day Physician Copland, Karleen Hampshire - MD Contact Type Call Who Is Calling Patient / Member / Family / Caregiver Call Type Triage / Clinical Relationship To Patient Self Return Phone Number [phone] (Secondary) Chief Complaint Abdominal Pain Reason for Call Symptomatic / Request for Health Information Initial Comment Caller states he has dark stools, abdominal pain (not severe pain), nausea, small to no bowel movement since Tuesday, and diarrhea. No vomiting. Translation No Nurse Assessment Nurse: Kizzie Bane, RN, Marylene Land Date/Time (Eastern Time): 03/06/2020 10:51:54 AM Confirm and document reason for call. If symptomatic, describe symptoms. ---Caller states he is having abdominal pain that started on Tuesday night. He has some nausea but no vomiting. No fever. Does the patient have any new or worsening symptoms? ---Yes Will a triage be completed? ---Yes Related visit to physician within the last 2 weeks? ---No Does the PT have any chronic conditions? (i.e. diabetes, asthma, this includes High risk factors for pregnancy, etc.) ---No Is this a behavioral health or substance abuse call? ---No Guidelines Guideline Title Affirmed Question Affirmed Notes Nurse Date/Time Lamount Cohen Time) Abdominal Pain - Male [1] MODERATE pain (e.g., interferes with normal activities) AND [2] pain comes and goes (cramps) AND [3] present > 24 hours (Exception: pain with Vomiting or Diarrhea - see that Guideline) Kizzie Bane, RN, Marylene Land 03/06/2020 10:53:06 AM Disp. Time Lamount Cohen Time) Disposition Final User 03/06/2020 10:58:55 AM See PCP  within 24 Hours Yes Kizzie Bane, RN, Marylene Land PLEASE NOTE: All timestamps contained within this report are represented as [place] Standard Time. CONFIDENTIALTY NOTICE: This fax transmission is intended only for the addressee. It contains information that is legally privileged, confidential or otherwise protected from use or disclosure. If you are not the intended recipient, you are strictly prohibited from reviewing, disclosing, copying using or disseminating any of this information or taking any action in reliance on or regarding this information. If you have received this fax in error, please notify us immediately by telephone so that we can arrange for its return to [place]. Phone: [phone], Toll-Free: [phone], Fax: [phone] Page: 2 of 2 Call Id: [id number] Caller Disagree/Comply Comply Caller Understands Yes PreDisposition Did not know what to do Care Advice Given Per Guideline SEE PCP WITHIN 24 HOURS: * IF OFFICE WILL BE OPEN: You need to be examined within the next 24 hours. Call your doctor (or NP/PA) when the office opens and make an appointment. CRAMPS: * Your cramps may be due to an intestinal virus or from something that you ate. DIET: * Drink adequate fluids. Eat a bland diet. * Avoid alcohol or caffeinated beverages. * Avoid greasy or fatty foods. OTC MEDS - BISMUTH SUBSALICYLATE (E.G., KAOPECTATE, PEPTO-BISMOL): * Helps reduce abdominal cramping, diarrhea, and vomiting. * Adult dosage: two tablets or two tablespoons (30 ml) PO. Maximum of 8 doses in a 24 hour period. * Do not use for more than 2 days. CALL BACK IF: * Severe pain lasts over 1 hour * You become worse * Constant pain lasts over 2 hours CARE ADVICE given per Abdominal Pain, Male (Adult) guideline. Referrals GO TO FACILITY UNDECIDED

## 2020-03-11 ENCOUNTER — Ambulatory Visit: Payer: 59 | Admitting: Psychology

## 2020-03-12 ENCOUNTER — Ambulatory Visit (INDEPENDENT_AMBULATORY_CARE_PROVIDER_SITE_OTHER): Payer: 59 | Admitting: Psychology

## 2020-03-12 DIAGNOSIS — F410 Panic disorder [episodic paroxysmal anxiety] without agoraphobia: Secondary | ICD-10-CM

## 2020-03-17 ENCOUNTER — Ambulatory Visit (INDEPENDENT_AMBULATORY_CARE_PROVIDER_SITE_OTHER): Payer: 59 | Admitting: Psychology

## 2020-03-17 DIAGNOSIS — F410 Panic disorder [episodic paroxysmal anxiety] without agoraphobia: Secondary | ICD-10-CM | POA: Diagnosis not present

## 2020-03-19 ENCOUNTER — Telehealth (INDEPENDENT_AMBULATORY_CARE_PROVIDER_SITE_OTHER): Payer: 59 | Admitting: Family Medicine

## 2020-03-19 ENCOUNTER — Encounter: Payer: Self-pay | Admitting: Family Medicine

## 2020-03-19 VITALS — Ht 66.75 in | Wt 196.0 lb

## 2020-03-19 DIAGNOSIS — J309 Allergic rhinitis, unspecified: Secondary | ICD-10-CM | POA: Diagnosis not present

## 2020-03-19 DIAGNOSIS — J4540 Moderate persistent asthma, uncomplicated: Secondary | ICD-10-CM

## 2020-03-19 NOTE — Progress Notes (Signed)
VIRTUAL VISIT Due to national recommendations of social distancing due to COVID 19, a virtual visit is felt to be most appropriate for this patient at this time.   I connected with the patient on 03/19/20 at  2:20 PM EDT by virtual telehealth platform and verified that I am speaking with the correct person using two identifiers.   Interactive audio and video telecommunications were attempted between this provider and patient, however failed, due to patient having technical difficulties OR patient did not have access to video capability.  We continued and completed visit with audio only.   I discussed the limitations, risks, security and privacy concerns of performing an evaluation and management service by  virtual telehealth platform and the availability of in person appointments. I also discussed with the patient that there may be a patient responsible charge related to this service. The patient expressed understanding and agreed to proceed.  Patient location: Home Provider Location: [facility] Participants: [name] and Newt Lukes   Chief Complaint  Patient presents with  . Headache  . Allergies    History of Present Illness:   34 year old male with history of allergic rhinitis and moderate persistent asthma patient presents with new onset  Headache  In bilateral temples and above eye off and on  4 days  Associated with nasal congestion and post nasal drip.  No cough, no myalgia, no diarrhea, no loss of taste or smell  bilateral ear pressure   No SOB, occ wheeze.. improves with albuterol prn  He is on Singulair and Advair for his asthma.  He is on flonase for allergies   No known COVID exposure.  S/P COVID vaccine x 2  COVID 19 screen No recent travel or known exposure to COVID19 .  The importance of social distancing was discussed today.   Review of Systems  Constitutional: Negative for chills and fever.  HENT: Positive for congestion and sinus pain.  Negative for ear pain and sore throat.   Eyes: Negative for pain and redness.  Respiratory: Negative for cough and shortness of breath.   Cardiovascular: Negative for chest pain, palpitations and leg swelling.  Gastrointestinal: Negative for abdominal pain, blood in stool, constipation, diarrhea, nausea and vomiting.  Genitourinary: Negative for dysuria.  Musculoskeletal: Negative for falls and myalgias.  Skin: Negative for rash.  Neurological: Negative for dizziness.  Psychiatric/Behavioral: Negative for depression. The patient is not nervous/anxious.       Past Medical History:  Diagnosis Date  . Allergy   . Asthma     reports that he has never smoked. He has never used smokeless tobacco. He reports current alcohol use. He reports that he does not use drugs.   Current Outpatient Medications:  .  albuterol (PROAIR HFA) 108 (90 Base) MCG/ACT inhaler, INHALE 2 PUFFS 4 TIMES A DAY AS NEEDED FOR WHEEZING, Disp: 18 g, Rfl: 1 .  busPIRone (BUSPAR) 10 MG tablet, TAKE 1 TABLET BY MOUTH TWICE A DAY, Disp: 180 tablet, Rfl: 0 .  fluticasone (FLONASE) 50 MCG/ACT nasal spray, PLACE 2 SPRAYS INTO THE NOSE DAILY., Disp: 48 mL, Rfl: 3 .  fluticasone-salmeterol (ADVAIR HFA) 115-21 MCG/ACT inhaler, TAKE 2 PUFFS BY MOUTH TWICE A DAY, Disp: 12 g, Rfl: 5 .  montelukast (SINGULAIR) 10 MG tablet, Take 1 tablet (10 mg total) by mouth daily as needed., Disp: 30 tablet, Rfl: 5 .  omeprazole (PRILOSEC) 40 MG capsule, Take 40 mg by mouth daily., Disp: , Rfl:  .  tiZANidine (  ZANAFLEX) 4 MG tablet, Take 1 tablet (4 mg total) by mouth every 8 (eight) hours as needed for muscle spasms., Disp: 30 tablet, Rfl: 1   Observations/Objective: Height 5' 6.75" (1.695 m), weight 196 lb (88.9 kg).  Physical Exam  Physical Exam Constitutional:      General: The patient is not in acute distress. Pulmonary:     Effort: Pulmonary effort is normal. No respiratory distress.  Neurological:     Mental Status: The patient is  alert and oriented to person, place, and time.  Psychiatric:        Mood and Affect: Mood normal.        Behavior: Behavior normal.   Assessment and Plan   Allergic sinusitis Treat with nasal saline, nasal steroid and add zyrtec at bedtime.  Consider COVID testing and quarantine until negative result..  Return precautions given.  Moderate persistent asthma, uncomplicated No current asthma flare   I discussed the assessment and treatment plan with the patient. The patient was provided an opportunity to ask questions and all were answered. The patient agreed with the plan and demonstrated an understanding of the instructions.   The patient was advised to call back or seek an in-person evaluation if the symptoms worsen or if the condition fails to improve as anticipated.    Total visit time 15 minutes, > 50% spent counseling and cordinating patients care.  [name], MD

## 2020-03-19 NOTE — Assessment & Plan Note (Signed)
No current asthma flare

## 2020-03-19 NOTE — Assessment & Plan Note (Signed)
Treat with nasal saline, nasal steroid and add zyrtec at bedtime.  Consider COVID testing and quarantine until negative result..  Return precautions given.

## 2020-03-24 ENCOUNTER — Encounter: Payer: Self-pay | Admitting: Family Medicine

## 2020-03-24 NOTE — Progress Notes (Signed)
Micheal Jerger T. Micheal Tierce, MD, CAQ Sports Medicine  Primary Care and Sports Medicine [facility] at [facility] [address] [place], [address]  Phone: [phone]  FAX: [phone]  Micheal Ford - 34 y.o. male  MRN [id number]  Date of Birth: February 26, 1986  Date: 03/25/2020  PCP: [name], MD  Referral: [name], MD  Chief Complaint  Patient presents with  . Annual Exam    This visit occurred during the SARS-CoV-2 public health emergency.  Safety protocols were in place, including screening questions prior to the visit, additional usage of staff PPE, and extensive cleaning of exam room while observing appropriate contact time as indicated for disinfecting solutions.   Patient Care Team: [name], MD as PCP - General (Family Medicine) Subjective:   Micheal Ford is a 34 y.o. pleasant patient who presents with the following:  Preventative Health Maintenance Visit:  Health Maintenance Summary Reviewed and updated, unless pt declines services.  Tobacco History Reviewed. Alcohol: No concerns, no excessive use Exercise Habits: Some activity, rec at least 30 mins 5 times a week STD concerns: no risk or activity to increase risk Drug Use: None  Micheal Ford is a relatively healthy young gentleman at age 67, and he presents today for a general physical.  He does need his Tdap - he wants to hold off and do a nurse visit later for this and flu  Does have some headaches.    Wt Readings from Last 3 Encounters:  03/25/20 196 lb 4 oz (89 kg)  03/19/20 196 lb (88.9 kg)  02/27/20 196 lb 9.6 oz (89.2 kg)      Health Maintenance  Topic Date Due  . Hepatitis C Screening  Never done  . TETANUS/TDAP  Never done  . INFLUENZA VACCINE  08/20/2020 (Originally 12/22/2019)  . COVID-19 Vaccine  Completed  . HIV Screening  Completed   Immunization History  Administered Date(s) Administered  . Influenza,inj,Quad PF,6+ Mos 02/26/2018  . PFIZER  SARS-COV-2 Vaccination 08/15/2019, 09/10/2019   Patient Active Problem List   Diagnosis Date Noted  . Seasonal and perennial allergic rhinitis 09/25/2018  . Moderate persistent asthma 09/25/2018  . GAD (generalized anxiety disorder) 11/08/2010    Past Medical History:  Diagnosis Date  . GAD (generalized anxiety disorder) 11/08/2010  . Moderate persistent asthma 09/25/2018  . Seasonal and perennial allergic rhinitis 09/25/2018    History reviewed. No pertinent surgical history.  History reviewed. No pertinent family history.  Past Medical History, Surgical History, Social History, Family History, Problem List, Medications, and Allergies have been reviewed and updated if relevant.  Review of Systems: Pertinent positives are listed above.  Otherwise, a full 14 point review of systems has been done in full and it is negative except where it is noted positive.  Objective:   BP 120/80   Pulse 73   Temp 97.8 F (36.6 C) (Temporal)   Ht 5' 7.5" (1.715 m)   Wt 196 lb 4 oz (89 kg)   SpO2 97%   BMI 30.28 kg/m  Ideal Body Weight: Weight in (lb) to have BMI = 25: 161.7  Ideal Body Weight: Weight in (lb) to have BMI = 25: 161.7 No exam data present Depression screen [facility] 2/9 03/25/2020 12/19/2018 04/17/2018  Decreased Interest 0 0 0  Down, Depressed, Hopeless 0 0 0  PHQ - 2 Score 0 0 0     GEN: well developed, well nourished, no acute distress Eyes: conjunctiva and lids normal, PERRLA, EOMI ENT:  TM clear, nares clear, oral exam WNL Neck: supple, no lymphadenopathy, no thyromegaly, no JVD Pulm: clear to auscultation and percussion, respiratory effort normal CV: regular rate and rhythm, S1-S2, no murmur, rub or gallop, no bruits, peripheral pulses normal and symmetric, no cyanosis, clubbing, edema or varicosities GI: soft, non-tender; no hepatosplenomegaly, masses; active bowel sounds all quadrants GU: deferred Lymph: no cervical, axillary or inguinal adenopathy MSK: gait normal,  muscle tone and strength WNL, no joint swelling, effusions, discoloration, crepitus  SKIN: clear, good turgor, color WNL, no rashes, lesions, or ulcerations Neuro: normal mental status, normal strength, sensation, and motion Psych: alert; oriented to person, place and time, normally interactive and not anxious or depressed in appearance.  All labs reviewed with patient. Results for orders placed or performed in visit on 02/24/20  Hemoglobin A1c  Result Value Ref Range   Hgb A1c MFr Bld 5.7 4.6 - 6.5 %  Basic metabolic panel  Result Value Ref Range   Sodium 138 135 - 145 mEq/L   Potassium 4.0 3.5 - 5.1 mEq/L   Chloride 101 96 - 112 mEq/L   CO2 29 19 - 32 mEq/L   Glucose, Bld 154 (H) 70 - 99 mg/dL   BUN 13 6 - 23 mg/dL   Creatinine, Ser 4.49 0.40 - 1.50 mg/dL   GFR 67.59 >16.38 mL/min   Calcium 9.4 8.4 - 10.5 mg/dL  Hepatic function panel  Result Value Ref Range   Total Bilirubin 1.4 (H) 0.2 - 1.2 mg/dL   Bilirubin, Direct 0.2 0.0 - 0.3 mg/dL   Alkaline Phosphatase 56 39 - 117 U/L   AST 15 0 - 37 U/L   ALT 34 0 - 53 U/L   Total Protein 7.3 6.0 - 8.3 g/dL   Albumin 4.7 3.5 - 5.2 g/dL  CBC with Differential/Platelet  Result Value Ref Range   WBC 8.8 4.0 - 10.5 K/uL   RBC 5.41 4.22 - 5.81 Mil/uL   Hemoglobin 15.5 13.0 - 17.0 g/dL   HCT 46.6 39 - 52 %   MCV 87.4 78.0 - 100.0 fl   MCHC 32.7 30.0 - 36.0 g/dL   RDW 59.9 35.7 - 01.7 %   Platelets 281.0 150 - 400 K/uL   Neutrophils Relative % 83.7 (H) 43 - 77 %   Lymphocytes Relative 12.5 12 - 46 %   Monocytes Relative 2.9 (L) 3 - 12 %   Eosinophils Relative 0.2 0 - 5 %   Basophils Relative 0.7 0 - 3 %   Neutro Abs 7.4 1.4 - 7.7 K/uL   Lymphs Abs 1.1 0.7 - 4.0 K/uL   Monocytes Absolute 0.3 0.1 - 1.0 K/uL   Eosinophils Absolute 0.0 0.0 - 0.7 K/uL   Basophils Absolute 0.1 0.0 - 0.1 K/uL  Lipid panel  Result Value Ref Range   Cholesterol 212 (H) 0 - 200 mg/dL   Triglycerides 793.9 0 - 149 mg/dL   HDL 03.00 (L) >92.33 mg/dL    VLDL 00.7 0.0 - 62.2 mg/dL   LDL Cholesterol 633 (H) 0 - 99 mg/dL   Total CHOL/HDL Ratio 5    NonHDL 173.08     Assessment and Plan:     ICD-10-CM   1. Healthcare maintenance  Z00.00    BS is up with a normal a1c - he is going to work hard on exercise and diet changes  Answered all questions about allergies, GERD, headaches, neck pain.  All relatively benign right now.  Health Maintenance Exam: The patient's preventative  maintenance and recommended screening tests for an annual wellness exam were reviewed in full today. Brought up to date unless services declined.  Counselled on the importance of diet, exercise, and its role in overall health and mortality. The patient's FH and SH was reviewed, including their home life, tobacco status, and drug and alcohol status.  Follow-up in 1 year for physical exam or additional follow-up below.  Follow-up: No follow-ups on file. Or follow-up in 1 year if not noted.  No orders of the defined types were placed in this encounter.  There are no discontinued medications. No orders of the defined types were placed in this encounter.   Signed,  Elpidio Galea. Huber Mathers, MD   Allergies as of 03/25/2020   No Known Allergies     Medication List       Accurate as of March 25, 2020  9:00 AM. If you have any questions, ask your nurse or doctor.        Advair HFA 115-21 MCG/ACT inhaler Generic drug: fluticasone-salmeterol TAKE 2 PUFFS BY MOUTH TWICE A DAY   albuterol 108 (90 Base) MCG/ACT inhaler Commonly known as: ProAir HFA INHALE 2 PUFFS 4 TIMES A DAY AS NEEDED FOR WHEEZING   busPIRone 10 MG tablet Commonly known as: BUSPAR TAKE 1 TABLET BY MOUTH TWICE A DAY   fluticasone 50 MCG/ACT nasal spray Commonly known as: FLONASE PLACE 2 SPRAYS INTO THE NOSE DAILY.   montelukast 10 MG tablet Commonly known as: SINGULAIR Take 1 tablet (10 mg total) by mouth daily as needed.   omeprazole 40 MG capsule Commonly known as: PRILOSEC Take  40 mg by mouth daily.   tiZANidine 4 MG tablet Commonly known as: ZANAFLEX Take 1 tablet (4 mg total) by mouth every 8 (eight) hours as needed for muscle spasms.

## 2020-03-25 ENCOUNTER — Encounter: Payer: Self-pay | Admitting: Family Medicine

## 2020-03-25 ENCOUNTER — Other Ambulatory Visit: Payer: Self-pay

## 2020-03-25 ENCOUNTER — Ambulatory Visit (INDEPENDENT_AMBULATORY_CARE_PROVIDER_SITE_OTHER): Payer: 59 | Admitting: Family Medicine

## 2020-03-25 VITALS — BP 120/80 | HR 73 | Temp 97.8°F | Ht 67.5 in | Wt 196.2 lb

## 2020-03-25 DIAGNOSIS — Z0000 Encounter for general adult medical examination without abnormal findings: Secondary | ICD-10-CM

## 2020-03-26 ENCOUNTER — Ambulatory Visit: Payer: 59

## 2020-03-31 ENCOUNTER — Other Ambulatory Visit: Payer: Self-pay

## 2020-03-31 ENCOUNTER — Ambulatory Visit: Payer: 59 | Admitting: Psychology

## 2020-03-31 ENCOUNTER — Ambulatory Visit: Payer: 59 | Attending: Family Medicine

## 2020-03-31 DIAGNOSIS — M546 Pain in thoracic spine: Secondary | ICD-10-CM | POA: Diagnosis not present

## 2020-03-31 DIAGNOSIS — M542 Cervicalgia: Secondary | ICD-10-CM | POA: Insufficient documentation

## 2020-03-31 DIAGNOSIS — M62830 Muscle spasm of back: Secondary | ICD-10-CM | POA: Diagnosis present

## 2020-03-31 NOTE — Therapy (Signed)
[facility] PHYSICAL AND SPORTS MEDICINE [address], [place], [address] Phone: [phone]   Fax:  [phone]  Physical Therapy Evaluation  Patient Details  Name: Micheal Ford MRN: [id number] Date of Birth: Apr 01, 1986 Referring Provider (PT): Rodolph Bong, MD    Encounter Date: 03/31/2020   PT End of Session - 03/31/20 0856    Visit Number 1    Number of Visits 13    Date for PT Re-Evaluation 05/12/20    PT Start Time 0822    PT Stop Time 0900    PT Time Calculation (min) 38 min    Activity Tolerance Patient tolerated treatment well    Behavior During Therapy [facility] for tasks assessed/performed           Past Medical History:  Diagnosis Date  . GAD (generalized anxiety disorder) 11/08/2010  . Moderate persistent asthma 09/25/2018  . Seasonal and perennial allergic rhinitis 09/25/2018    History reviewed. No pertinent surgical history.  There were no vitals filed for this visit.    Subjective Assessment - 03/31/20 0823    Subjective Patient is a 34 y/o male with a c/c of thoracic pain that occurred after falling around one month ago while at his last job. Patient states that his pain radiates from his thoracic spine into his head and disrupts his sleep. Patient states that lying down, standing still, and sitting tend to increase symptoms in mid back that radiate to his neck/head.  Patient worst pain is 5/10 on NPRS and is able to decrease the pain by performing stretching his neck and rolling his shoulders to decrease symptoms in back. Patient's best pain is able to get down to a 0/10 on NPRS but is currently at a 2/10 on NPRS.  Patient goal is to help decrease pain and learn techniques for managing symptoms if they were to arise again.    Pertinent History asthma    Limitations Sitting;Walking;Lifting;Standing    How long can you sit comfortably? ~5 minutes    How long can you stand comfortably? ~unlimited    How long can you walk  comfortably? ~5 minutes    Diagnostic tests N/A    Patient Stated Goals To decrease pain and techniques to manage symptoms if they were to reoccurr    Currently in Pain? Yes    Pain Score 2     Pain Location Back              [facility] PT Assessment - 03/31/20 0001      Assessment   Medical Diagnosis Thoracic Pain     Referring Provider (PT) Rodolph Bong, MD     Onset Date/Surgical Date 02/29/20    Hand Dominance Right    Prior Therapy no      Precautions   Precautions None      Restrictions   Weight Bearing Restrictions No      Balance Screen   Has the patient fallen in the past 6 months Yes    How many times? 1    Has the patient had a decrease in activity level because of a fear of falling?  No    Is the patient reluctant to leave their home because of a fear of falling?  No      Prior Function   Level of Independence Independent    Vocation Full time employment    Vocation Requirements Walking, Standing, Lifting, Bending      Cognition  Overall Cognitive Status Within Functional Limits for tasks assessed      ROM / Strength   AROM / PROM / Strength AROM;Strength      AROM   Overall AROM  Within functional limits for tasks performed    Overall AROM Comments Pain with OP    AROM Assessment Site Shoulder;Cervical;Lumbar;Thoracic      Strength   Overall Strength Comments Pain with Cervical MMT    Strength Assessment Site Shoulder;Cervical;Thoracic    Cervical Flexion 5/5    Cervical Extension 5/5    Cervical - Right Side Bend 5/5    Cervical - Left Side Bend 5/5    Cervical - Right Rotation 5/5    Cervical - Left Rotation 5/5      Flexibility   Soft Tissue Assessment /Muscle Length yes      Palpation   Palpation comment TTP along cervical extensors, thoracic extensors, and suboccipital            Therapeutic Exercise (HEP) Cervical Retraction with OP x10 Banded Rows (Shoulder Retraction) GTB x10 Seated Tree Huggers  x10           Objective measurements completed on examination: See above findings.               PT Education - 03/31/20 0856    Education Details Educated on POC and HEP    Person(s) Educated Patient    Methods Explanation;Demonstration    Comprehension Verbalized understanding;Returned demonstration            PT Short Term Goals - 03/31/20 0859      PT SHORT TERM GOAL #1   Title Patient will demonstrate independence with HEP to maximize rehab potential.    Time 3    Period Weeks    Status New    Target Date 04/21/20             PT Long Term Goals - 03/31/20 0859      PT LONG TERM GOAL #1   Title Patient will demonstrate independence with progressive HEP to maintain progress made during POC.    Time 6    Period Weeks    Status New    Target Date 05/12/20      PT LONG TERM GOAL #2   Title Patient will increase FOTO score to 94 to show an increase in patient's ability to perform functional activities without current limitations.    Baseline 03/31/20: 85    Time 6    Period Weeks    Status New    Target Date 05/12/20      PT LONG TERM GOAL #3   Title Patient will be able to walk for at least half of a work shift to show imrpovement in symptoms and ability to perform job duites without pain or limitation.    Baseline 03/31/20: 5 minutes before pain arises    Time 6    Period Weeks    Status New    Target Date 05/12/20                  Plan - 03/31/20 1008    Clinical Impression Statement Patient is a 34 y/o male with a c/c of thoracic pain that occurred after falling around one month ago while at his last job. Upon evaluation, patient presented with TTP to cervical extensors, thoracic extensors, and suboccipital musculature.  Patient had pain with PAVIM from C6-T9 with pain increasing inferiorly with T6-T9 reproducing concordant pain. Patients cervical  and thoracic ROM was WNL, had pain with OP.  Patient's MMT of cervical spine were  strong with pain and had no pain with shoulder or scapular MMT with moderate strength.   Performed grade 1-2 mobilization to thoracic spine with no decrease in symptoms.  Patient's symptoms are consistent with acuteness of MOI and muscle gaurding.  Patient will benefit from skilled therapy to improve patients limitations and return to prior level of function.    Examination-Activity Limitations Bend;Lift;Sit;Stand    Examination-Participation Restrictions Occupation    Stability/Clinical Decision Making Stable/Uncomplicated    Clinical Decision Making Low    Rehab Potential Good    PT Frequency 2x / week    PT Duration 6 weeks    PT Treatment/Interventions ADLs/Self Care Home Management;Biofeedback;Electrical Stimulation;DME Instruction;Gait training;Stair training;Functional mobility training;Therapeutic activities;Therapeutic exercise;Balance training;Neuromuscular re-education;Patient/family education;Manual techniques;Passive range of motion;Dry needling;Energy conservation;Spinal Manipulations;Joint Manipulations    PT Next Visit Plan Review HEP and begin cervical and thoracic strengthening    PT Home Exercise Plan Cervical Retraction with OP, Banded Rows (Shoulder Retraction) GTB, Seated Tree Huggers    Consulted and Agree with Plan of Care Patient           Patient will benefit from skilled therapeutic intervention in order to improve the following deficits and impairments:  Decreased mobility, Hypomobility, Increased muscle spasms, Improper body mechanics, Pain, Decreased strength, Decreased activity tolerance  Visit Diagnosis: Pain in thoracic spine  Muscle spasm of back  Cervicalgia     Problem List Patient Active Problem List   Diagnosis Date Noted  . Seasonal and perennial allergic rhinitis 09/25/2018  . Moderate persistent asthma 09/25/2018  . GAD (generalized anxiety disorder) 11/08/2010   11:05 AM, 03/31/20 Luanna Salk, SPT Student Physical Therapist [facility]  [phone]  Luanna Salk 03/31/2020, 11:05 AM  [facility] PHYSICAL AND SPORTS MEDICINE [address], [place], [address] Phone: [phone]   Fax:  [phone]  Name: Micheal Ford MRN: [id number] Date of Birth: 08-30-1985

## 2020-04-02 ENCOUNTER — Ambulatory Visit (INDEPENDENT_AMBULATORY_CARE_PROVIDER_SITE_OTHER): Payer: 59 | Admitting: Psychology

## 2020-04-02 ENCOUNTER — Ambulatory Visit: Payer: 59

## 2020-04-02 DIAGNOSIS — F410 Panic disorder [episodic paroxysmal anxiety] without agoraphobia: Secondary | ICD-10-CM

## 2020-04-05 ENCOUNTER — Other Ambulatory Visit: Payer: Self-pay | Admitting: Family Medicine

## 2020-04-07 ENCOUNTER — Ambulatory Visit: Payer: 59

## 2020-04-07 ENCOUNTER — Other Ambulatory Visit: Payer: Self-pay

## 2020-04-07 NOTE — Progress Notes (Signed)
Nadeen Shipman T. Corryn Madewell, MD Primary Care and Sports Medicine [facility] at [facility] [address] [place], [address] Phone: [phone]  FAX: [phone]  LAVERNE KLUGH - 34 y.o. male  MRN [id number]  Date of Birth: 10-13-1985  Visit Date: 04/08/2020  PCP: [name], MD  Referred by: [name], MD Chief Complaint  Patient presents with  . Arm Pain    Right  . Leg Pain    Right  . Allergies   Virtual Visit via Video Note:  I connected with  EYDAN CHIANESE on 04/08/2020 10:20 AM EST by a video enabled telemedicine application and verified that I am speaking with the correct person using two identifiers.   Location patient: home computer, tablet, or smartphone Location provider: work or home office Consent: Verbal consent directly obtained from [employer]. Persons participating in the virtual visit: patient, provider  I discussed the limitations of evaluation and management by telemedicine and the availability of in person appointments. The patient expressed understanding and agreed to proceed.  Interactive audio and video telecommunications were attempted between this provider and patient, however failed, due to patient having technical difficulties OR patient did not have access to video capability.  We continued and completed visit with audio only.    History of Present Illness:  He has having some headaches, sinus congestion as well as coughing.  He does have known significant allergies, but most of his regular allergy prep is not helping at all.  He does feel a lot more congested, and he normally does not have a cough.  He denies any known sick contacts.  Denies any other symptoms such as diarrhea, nausea, vomiting, neurological changes.  His knee has started to tingle some as well as his right arm.  This does not really hurt that much.  He equates this to the onset of his physical therapy.  Review of Systems as above: See pertinent  positives and pertinent negatives per HPI No acute distress verbally   Observations/Objective/Exam:  An attempt was made to discern vital signs over the phone and per patient if applicable and possible.   General:    Alert, Oriented, appears well and in no acute distress  Pulmonary:     On inspection no signs of respiratory distress.  Psych / Neurological:     Pleasant and cooperative.  Assessment and Plan:    ICD-10-CM   1. Sinus congestion  R09.81   2. Myalgia  M79.10   3. Cough  R05.9   4. Numbness and tingling of right arm  R20.0    R20.2    Total encounter time: 20 minutes. This includes total time spent on the day of encounter.  Recommended to get a COVID-19 test today.  More likely allergy flare, given dose of some steroids which hopefully will help with his allergy versus acute illness symptoms.  I do not know what to make of his tingling, and this may or may a lot be related to physical therapy, but I am more doubtful of this.  Certainly could have some discogenic pathology.  For now burst of steroids and have follow-up with his physical therapist next week.  I discussed the assessment and treatment plan with the patient. The patient was provided an opportunity to ask questions and all were answered. The patient agreed with the plan and demonstrated an understanding of the instructions.   The patient was advised to call back or seek an in-person evaluation  if the symptoms worsen or if the condition fails to improve as anticipated.  Follow-up: prn unless noted otherwise below No follow-ups on file.  Meds ordered this encounter  Medications  . predniSONE (DELTASONE) 20 MG tablet    Sig: 2 tabs po daily for 5 days, then 1 tab po daily for 5 days    Dispense:  15 tablet    Refill:  0   No orders of the defined types were placed in this encounter.   Signed,  Elpidio Galea. Milford Cilento, MD

## 2020-04-08 ENCOUNTER — Telehealth: Payer: Self-pay | Admitting: Family Medicine

## 2020-04-08 ENCOUNTER — Encounter: Payer: Self-pay | Admitting: Family Medicine

## 2020-04-08 ENCOUNTER — Telehealth (INDEPENDENT_AMBULATORY_CARE_PROVIDER_SITE_OTHER): Payer: 59 | Admitting: Family Medicine

## 2020-04-08 ENCOUNTER — Other Ambulatory Visit: Payer: Self-pay | Admitting: Family Medicine

## 2020-04-08 VITALS — Temp 95.6°F | Ht 67.5 in | Wt 180.0 lb

## 2020-04-08 DIAGNOSIS — R059 Cough, unspecified: Secondary | ICD-10-CM | POA: Diagnosis not present

## 2020-04-08 DIAGNOSIS — R0981 Nasal congestion: Secondary | ICD-10-CM | POA: Diagnosis not present

## 2020-04-08 DIAGNOSIS — R200 Anesthesia of skin: Secondary | ICD-10-CM | POA: Diagnosis not present

## 2020-04-08 DIAGNOSIS — M791 Myalgia, unspecified site: Secondary | ICD-10-CM

## 2020-04-08 DIAGNOSIS — R202 Paresthesia of skin: Secondary | ICD-10-CM

## 2020-04-08 MED ORDER — PREDNISONE 20 MG PO TABS: ORAL_TABLET | ORAL | 0 refills | Status: DC

## 2020-04-08 NOTE — Telephone Encounter (Signed)
As above.

## 2020-04-08 NOTE — Telephone Encounter (Signed)
Micheal Ford notified prescription for prednisone has been sent to his pharmacy.

## 2020-04-08 NOTE — Telephone Encounter (Signed)
Pt called in due to he was told that a script for prednisone would be filled but haven't gotten it yet.   Please advise

## 2020-04-08 NOTE — Telephone Encounter (Signed)
This was sent in.  I did it a little bit later than his office visit.

## 2020-04-09 ENCOUNTER — Ambulatory Visit: Payer: 59

## 2020-04-14 ENCOUNTER — Ambulatory Visit: Payer: 59

## 2020-04-14 ENCOUNTER — Ambulatory Visit (INDEPENDENT_AMBULATORY_CARE_PROVIDER_SITE_OTHER): Payer: 59 | Admitting: Psychology

## 2020-04-14 DIAGNOSIS — F410 Panic disorder [episodic paroxysmal anxiety] without agoraphobia: Secondary | ICD-10-CM

## 2020-04-20 NOTE — Progress Notes (Signed)
Vials exp 04-22-21 

## 2020-04-21 ENCOUNTER — Ambulatory Visit: Payer: 59

## 2020-04-23 DIAGNOSIS — J3089 Other allergic rhinitis: Secondary | ICD-10-CM

## 2020-04-28 ENCOUNTER — Ambulatory Visit: Payer: 59 | Admitting: Psychology

## 2020-05-12 ENCOUNTER — Ambulatory Visit (INDEPENDENT_AMBULATORY_CARE_PROVIDER_SITE_OTHER): Payer: 59 | Admitting: Psychology

## 2020-05-12 DIAGNOSIS — F410 Panic disorder [episodic paroxysmal anxiety] without agoraphobia: Secondary | ICD-10-CM | POA: Diagnosis not present

## 2020-05-20 ENCOUNTER — Encounter: Payer: Self-pay | Admitting: Family Medicine

## 2020-05-20 ENCOUNTER — Other Ambulatory Visit: Payer: Self-pay | Admitting: Family Medicine

## 2020-05-20 ENCOUNTER — Telehealth (INDEPENDENT_AMBULATORY_CARE_PROVIDER_SITE_OTHER): Payer: 59 | Admitting: Family Medicine

## 2020-05-20 ENCOUNTER — Other Ambulatory Visit: Payer: Self-pay

## 2020-05-20 ENCOUNTER — Other Ambulatory Visit: Payer: 59

## 2020-05-20 VITALS — BP 129/88 | Temp 96.7°F | Ht 67.5 in | Wt 180.0 lb

## 2020-05-20 DIAGNOSIS — R0981 Nasal congestion: Secondary | ICD-10-CM | POA: Diagnosis not present

## 2020-05-20 DIAGNOSIS — R0989 Other specified symptoms and signs involving the circulatory and respiratory systems: Secondary | ICD-10-CM

## 2020-05-20 DIAGNOSIS — J4541 Moderate persistent asthma with (acute) exacerbation: Secondary | ICD-10-CM | POA: Diagnosis not present

## 2020-05-20 MED ORDER — DOXYCYCLINE HYCLATE 100 MG PO TABS: 100.0000 mg | ORAL_TABLET | Freq: Two times a day (BID) | ORAL | 0 refills | Status: AC

## 2020-05-20 MED ORDER — PREDNISONE 20 MG PO TABS: ORAL_TABLET | ORAL | 0 refills | Status: DC

## 2020-05-20 NOTE — Progress Notes (Signed)
Micheal Dunn T. Krystelle Prashad, MD Primary Care and Sports Medicine [facility] at [facility] [address] [place], [address] Phone: [phone]  FAX: [phone]  Micheal Ford - 34 y.o. male  MRN [id number]  Date of Birth: February 23, 1986  Visit Date: 05/20/2020  PCP: [name], MD  Referred by: [name], MD  Virtual Visit via Video Note:  I connected with  Micheal Ford on 05/20/2020 10:20 AM EST by a video enabled telemedicine application and verified that I am speaking with the correct person using two identifiers.   Location patient: home computer, tablet, or smartphone Location provider: work or home office Consent: Verbal consent directly obtained from [employer]. Persons participating in the virtual visit: patient, provider  I discussed the limitations of evaluation and management by telemedicine and the availability of in person appointments. The patient expressed understanding and agreed to proceed.  History of Present Illness:  Had a coworker who has gotten a lot of people sick.  Now for about 3 weeks.  Can influence his asthma. Nose irritation and some blood.   He does not think that anybody in his office actively has Covid.  He has a at least 2 people who has had Covid in the relatively near past, but they did quarantine at home.  He does have asthma at baseline and he feels like he is having a little bit harder time taking a deep breath.  He has not had any fever or chills since the onset of his symptoms.  He is fully vaccinated without booster shot.  Immunization History  Administered Date(s) Administered  . Influenza,inj,Quad PF,6+ Mos 02/26/2018  . PFIZER SARS-COV-2 Vaccination 08/15/2019, 09/10/2019     Review of Systems as above: See pertinent positives and pertinent negatives per HPI No acute distress verbally   Observations/Objective/Exam:  An attempt was made to discern vital signs over the phone and per patient if  applicable and possible.   General:    Alert, Oriented, appears well and in no acute distress  Pulmonary:     On inspection no signs of respiratory distress.  Psych / Neurological:     Pleasant and cooperative.  Assessment and Plan:    ICD-10-CM   1. Moderate persistent asthma with exacerbation  J45.41   2. Sinus congestion  R09.81   3. Chest congestion  R09.89    Asthma with exacerbation, concomitant infection.  Unclear if this could be COVID-19.  Certainly could have other viral syndrome, sinusitis, multiple other symptoms or diagnosis is possible.  Unclear completely without a face-to-face examination.  Set up for COVID-19 testing this afternoon.  I discussed the assessment and treatment plan with the patient. The patient was provided an opportunity to ask questions and all were answered. The patient agreed with the plan and demonstrated an understanding of the instructions.   The patient was advised to call back or seek an in-person evaluation if the symptoms worsen or if the condition fails to improve as anticipated.  Follow-up: prn unless noted otherwise below No follow-ups on file.  Meds ordered this encounter  Medications  . doxycycline (VIBRA-TABS) 100 MG tablet    Sig: Take 1 tablet (100 mg total) by mouth 2 (two) times daily for 10 days.    Dispense:  20 tablet    Refill:  0  . predniSONE (DELTASONE) 20 MG tablet    Sig: 2 tabs po for 4 days, then 1 tab po for 3 days  Dispense:  11 tablet    Refill:  0   No orders of the defined types were placed in this encounter.   Signed,  Elpidio Galea. Deontre Allsup, MD

## 2020-05-20 NOTE — Progress Notes (Signed)
Appointment scheduled today at 2:15 pm.  Back door information provided.

## 2020-05-22 LAB — SARS-COV-2, NAA 2 DAY TAT

## 2020-05-22 LAB — NOVEL CORONAVIRUS, NAA: SARS-CoV-2, NAA: NOT DETECTED

## 2020-05-22 LAB — SPECIMEN STATUS REPORT

## 2020-05-26 ENCOUNTER — Ambulatory Visit (INDEPENDENT_AMBULATORY_CARE_PROVIDER_SITE_OTHER): Payer: 59 | Admitting: Psychology

## 2020-05-26 DIAGNOSIS — F410 Panic disorder [episodic paroxysmal anxiety] without agoraphobia: Secondary | ICD-10-CM

## 2020-05-30 ENCOUNTER — Telehealth (INDEPENDENT_AMBULATORY_CARE_PROVIDER_SITE_OTHER): Payer: 59 | Admitting: Family Medicine

## 2020-05-30 ENCOUNTER — Encounter: Payer: Self-pay | Admitting: Family Medicine

## 2020-05-30 VITALS — BP 129/86 | Temp 97.2°F | Ht 67.5 in | Wt 186.0 lb

## 2020-05-30 DIAGNOSIS — J4540 Moderate persistent asthma, uncomplicated: Secondary | ICD-10-CM

## 2020-05-30 DIAGNOSIS — U071 COVID-19: Secondary | ICD-10-CM | POA: Diagnosis not present

## 2020-05-30 NOTE — Progress Notes (Signed)
VIRTUAL VISIT Due to national recommendations of social distancing due to COVID 19, a virtual visit is felt to be most appropriate for this patient at this time.   I connected with the patient on 05/30/20 at 10:20 AM EST by virtual telehealth platform and verified that I am speaking with the correct person using two identifiers.   I discussed the limitations, risks, security and privacy concerns of performing an evaluation and management service by  virtual telehealth platform and the availability of in person appointments. I also discussed with the patient that there may be a patient responsible charge related to this service. The patient expressed understanding and agreed to proceed.  Patient location: Home Provider Location:  [place] Participants: Micheal Ford and Newt Lukes   Chief Complaint  Patient presents with  . Covid Positive    2 positive test at home. Wanted to review next steps and get documentation for work.     History of Present Illness: [name] with moderate persistent asthma presents with new COVID infection.  He reports he has started having sinus congestion, body achy, headaches. No fever. No current SOB, no wheeze, cough.  Using advair and albuterol  Prn.  Using tylenol prn.    COVID 19 screen COVID testing: Positive Home tests: #1 1/6  on  #2 on 1/7 COVID vaccine: 09/10/2019 , 08/15/2019 COVID exposure: Lives with mother who tested positive on 05/30/19  The importance of social distancing was discussed today.    Review of Systems  Constitutional: Negative for chills and fever.  HENT: Negative for congestion and ear pain.   Eyes: Negative for pain and redness.  Respiratory: Negative for cough and shortness of breath.   Cardiovascular: Negative for chest pain, palpitations and leg swelling.  Gastrointestinal: Negative for abdominal pain, blood in stool, constipation, diarrhea, nausea and vomiting.   Genitourinary: Negative for dysuria.  Musculoskeletal: Negative for falls and myalgias.  Skin: Negative for rash.  Neurological: Negative for dizziness.  Psychiatric/Behavioral: Negative for depression. The patient is not nervous/anxious.       Past Medical History:  Diagnosis Date  . GAD (generalized anxiety disorder) 11/08/2010  . Moderate persistent asthma 09/25/2018  . Seasonal and perennial allergic rhinitis 09/25/2018    reports that he has never smoked. He has never used smokeless tobacco. He reports current alcohol use. He reports that he does not use drugs.   Current Outpatient Medications:  .  albuterol (PROAIR HFA) 108 (90 Base) MCG/ACT inhaler, INHALE 2 PUFFS 4 TIMES A DAY AS NEEDED FOR WHEEZING, Disp: 18 g, Rfl: 1 .  busPIRone (BUSPAR) 10 MG tablet, TAKE 1 TABLET BY MOUTH TWICE A DAY, Disp: 180 tablet, Rfl: 1 .  doxycycline (VIBRA-TABS) 100 MG tablet, Take 1 tablet (100 mg total) by mouth 2 (two) times daily for 10 days., Disp: 20 tablet, Rfl: 0 .  fluticasone (FLONASE) 50 MCG/ACT nasal spray, PLACE 2 SPRAYS INTO THE NOSE DAILY., Disp: 48 mL, Rfl: 3 .  fluticasone-salmeterol (ADVAIR HFA) 115-21 MCG/ACT inhaler, TAKE 2 PUFFS BY MOUTH TWICE A DAY, Disp: 12 g, Rfl: 5 .  montelukast (SINGULAIR) 10 MG tablet, Take 1 tablet (10 mg total) by mouth daily as needed., Disp: 30 tablet, Rfl: 5 .  omeprazole (PRILOSEC) 40 MG capsule, Take 40 mg by mouth daily., Disp: , Rfl:    Observations/Objective: Blood pressure 129/86, temperature (!) 97.2 F (36.2 C), temperature source Temporal, height 5' 7.5" (1.715 m), weight 186 lb (84.4  kg).  Physical Exam  Physical Exam Constitutional:      General: The patient is not in acute distress. Pulmonary:     Effort: Pulmonary effort is normal. No respiratory distress.  Neurological:     Mental Status: The patient is alert and oriented to person, place, and time.  Psychiatric:        Mood and Affect: Mood normal.        Behavior: Behavior  normal.   Assessment and Plan Problem List Items Addressed This Visit    COVID-19 virus infection - Primary   Moderate persistent asthma    COVID19  Infection.  No clear sign of bacterial infection at this time.   No SOB. No current asthma flare.  No red flags/need for ER visit or in-person exam at respiratory clinic at this time..    Pt moderate risk for COVID complications given moderate persistent asthma.  Will refer for consideration of additional outpatient treatments.  If SOB begins symptoms worsening.. have low threshold for in-person exam, if severe shortness of breath ER visit recommended.  Can monitor Oxygen saturation at home with home monitor if able to obtain.  Go to ER if O2 sat < 90% on room air.  Reviewed home care and provided information through MyChart.  Recommended isolation until test returns. If returns positive 5 days quarantine recommended as long as mild symptoms and wearing mask for remainder 4 days. Provided info about prevention of spread of COVID 19.           I discussed the assessment and treatment plan with the patient. The patient was provided an opportunity to ask questions and all were answered. The patient agreed with the plan and demonstrated an understanding of the instructions.   The patient was advised to call back or seek an in-person evaluation if the symptoms worsen or if the condition fails to improve as anticipated.     Micheal Nora, MD

## 2020-05-30 NOTE — Assessment & Plan Note (Addendum)
COVID19  Infection.  No clear sign of bacterial infection at this time.   No SOB. No current asthma flare.  No red flags/need for ER visit or in-person exam at respiratory clinic at this time..    Pt moderate risk for COVID complications given moderate persistent asthma.  Will refer for consideration of additional outpatient treatments.  If SOB begins symptoms worsening.. have low threshold for in-person exam, if severe shortness of breath ER visit recommended.  Can monitor Oxygen saturation at home with home monitor if able to obtain.  Go to ER if O2 sat < 90% on room air.  Reviewed home care and provided information through MyChart.  Recommended isolation until test returns. If returns positive 5 days quarantine recommended as long as mild symptoms and wearing mask for remainder 4 days. Provided info about prevention of spread of COVID 19.

## 2020-05-31 ENCOUNTER — Other Ambulatory Visit: Payer: Self-pay | Admitting: Physician Assistant

## 2020-05-31 ENCOUNTER — Telehealth: Payer: Self-pay | Admitting: Physician Assistant

## 2020-05-31 MED ORDER — NIRMATRELVIR/RITONAVIR (PAXLOVID)TABLET: 3.0000 | ORAL_TABLET | Freq: Two times a day (BID) | ORAL | 0 refills | Status: AC

## 2020-05-31 NOTE — Telephone Encounter (Signed)
Outpatient Oral COVID Treatment Note  I connected with Micheal Ford on 05/31/2020/11:35 AM by telephone and verified that I am speaking with the correct person using two identifiers.  I discussed the limitations, risks, security, and privacy concerns of performing an evaluation and management service by telephone and the availability of in person appointments. I also discussed with the patient that there may be a patient responsible charge related to this service. The patient expressed understanding and agreed to proceed.  Patient location: home Provider location: home  Diagnosis: COVID-19 infection  Purpose of visit: Discussion of potential use of Molnupiravir or Paxlovid, a new treatment for mild to moderate COVID-19 viral infection in non-hospitalized patients.   Subjective: Patient is a 35 y.o. male who has been diagnosed with COVID 19 viral infection.  Their symptoms began on 1/7  with body aches and headaches. Tested with home tests on 1/6 due to exposure to mother. Has had primary series of Pfizer vaccine but no booster.     Past Medical History:  Diagnosis Date  . GAD (generalized anxiety disorder) 11/08/2010  . Moderate persistent asthma 09/25/2018  . Seasonal and perennial allergic rhinitis 09/25/2018    No Known Allergies  @PTAMEDS @  @PTAMEDS2 @   Objective: Patient sounds okay on the phone   Laboratory Data:  Recent Results (from the past 2160 hour(s))  Novel Coronavirus, NAA ([facility])     Status: None   Collection Time: 05/20/20 12:00 AM   Specimen: Oropharyngeal(OP) collection in vial transport medium   Oropharyngea  Result Value Ref Range   SARS-CoV-2, NAA Not Detected Not Detected    Comment: This nucleic acid amplification test was developed and its performance characteristics determined by 2161. Nucleic acid amplification tests include RT-PCR and TMA. This test has not been FDA cleared or approved. This test has been authorized by FDA under an  Emergency Use Authorization (EUA). This test is only authorized for the duration of time the declaration that circumstances exist justifying the authorization of the emergency use of in vitro diagnostic tests for detection of SARS-CoV-2 virus and/or diagnosis of COVID-19 infection under section 564(b)(1) of the Act, 21 U.S.C. 05/22/20) (1), unless the authorization is terminated or revoked sooner. When diagnostic testing is negative, the possibility of a false negative result should be considered in the context of a patient's recent exposures and the presence of clinical signs and symptoms consistent with COVID-19. An individual without symptoms of COVID-19 and who is not shedding SARS-CoV-2 virus wo uld expect to have a negative (not detected) result in this assay.   SARS-COV-2, NAA 2 DAY TAT     Status: None   Collection Time: 05/20/20 12:00 AM   Oropharyngea  Result Value Ref Range   SARS-CoV-2, NAA 2 DAY TAT Performed   Specimen status report     Status: None   Collection Time: 05/20/20 12:00 AM  Result Value Ref Range   specimen status report Comment     Comment: Please note Please note The date and/or time of collection was not indicated on the requisition as required by state and federal law.  The date of receipt of the specimen was used as the collection date if not supplied.      Assessment: 35 y.o. male with mild/moderate COVID 19 viral infection diagnosed on 1/6 at high risk for progression to severe COVID 19.  Plan:  This patient is a 35 y.o. male that meets the following criteria for Emergency Use Authorization of: Paxlovid 1. Age >50  yr AND > 40 kg 2. SARS-COV-2 positive test 3. Symptom onset < 5 days 4. Mild-to-moderate COVID disease with high risk for severe progression to hospitalization or death  I have spoken and communicated the following to the patient or parent/caregiver regarding: 1. Paxlovid is an unapproved drug that is authorized for use under an  Emergency Use Authorization.  2. There are no adequate, approved, available products for the treatment of COVID-19 in adults who have mild-to-moderate COVID-19 and are at high risk for progressing to severe COVID-19, including hospitalization or death. 3. Other therapeutics are currently authorized. For additional information on all products authorized for treatment or prevention of COVID-19, please see https://www.graham-miller.[email or web address]/.  4. There are benefits and risks of taking this treatment as outlined in the "Fact Sheet for Patients and Caregivers."  5. "Fact Sheet for Patients and Caregivers" was reviewed with patient. A hard copy will be provided to patient from pharmacy prior to the patient receiving treatment. 6. Patients should continue to self-isolate and use infection control measures (e.g., wear mask, isolate, social distance, avoid sharing personal items, clean and disinfect "high touch" surfaces, and frequent handwashing) according to CDC guidelines.  7. The patient or parent/caregiver has the option to accept or refuse treatment. 8. Patient medication history was reviewed for potential drug interactions:Interaction with home meds: flonse and Advair, which he will hold while on the Paxlovid 9. Patient's creatinine clearance was calculated to be 96, and they were therefore prescribed Normal dose (CrCl>60) - nirmatrelvir 150mg  tab (2 tablet) by mouth twice daily AND ritonavir 100mg  tab (1 tablet) by mouth twice daily   After reviewing above information with the patient, the patient agrees to receive Paxlovid.  Follow up instructions:    . Take prescription BID x 5 days as directed . Reach out to pharmacist for counseling on medication if desired . For concerns regarding further COVID symptoms please follow up with your PCP or urgent care . For urgent or life-threatening issues, seek care at your  local emergency department  The patient was provided an opportunity to ask questions, and all were answered. The patient agreed with the plan and demonstrated an understanding of the instructions.   Script sent to [facility] and opted to pick up RX.  The patient was advised to call their PCP or seek an in-person evaluation if the symptoms worsen or if the condition fails to improve as anticipated.   I provided 15 minutes of non face-to-face telephone visit time during this encounter, and > 50% was spent counseling as documented under my assessment & plan.  , PA-C 05/31/2020 /11:35 AM

## 2020-06-01 ENCOUNTER — Telehealth: Payer: Self-pay

## 2020-06-01 ENCOUNTER — Telehealth (HOSPITAL_COMMUNITY): Payer: Self-pay

## 2020-06-01 MED FILL — PAXLOVID 20 X 150 MG & 10 X: 20 X 150 MG | 5 days supply | Qty: 30 | Fill #0

## 2020-06-01 NOTE — Telephone Encounter (Signed)
Patient was prescribed oral covid treatment Paxlovid and treatment note was reviewed. Medication has been received by [facility] and reviewed for appropriateness.  Drug Interactions or Dosage Adjustments Noted: Hold Advair and Flonase. Use Buspar with caution.  Delivery Method: pick up  Patient contacted for counseling on 06/01/20 and verbalized understanding.   Delivery or Pick-Up Date: 06/01/20   [name] 06/01/2020, 7:58 AM [facility] Outpatient Pharmacist Phone# [phone]

## 2020-06-01 NOTE — Telephone Encounter (Signed)
[facility] Night - Client TELEPHONE ADVICE RECORD [employer] Patient Name: Micheal Ford Gender: Male DOB: Dec 31, 1985 Age: 35 Y 4 M 30 D Return Phone Number: [phone] (Primary) Address: City/State/Zip: [place] [address] Client [name] Night - Client Client Site [facility] - Night Physician [name] - MD Contact Type Call Who Is Calling Patient / Member / Family / Caregiver Call Type Triage / Clinical Relationship To Patient Self Return Phone Number [phone] (Primary) Chief Complaint Paging or Request for Consult Reason for Call Request to Speak to a Physician Initial Comment Caller states that he had a virtual appointment with Dr. Ermalene Searing regarding the positive at home COVID test. He states that even with th note from the MD he is needing proper documentation from the MD about the test. His work is saying he needs something more to excuse his absense and he is concerned this shouldn't wait until Monday. Caller would like to see if the on call can be paged. Translation No Nurse Assessment Nurse: [place], RN, Marchelle Folks Date/Time (Eastern Time): 05/30/2020 5:40:03 PM Confirm and document reason for call. If symptomatic, describe symptoms. ---caller states his doctor note was not sufficient for his job. he called his employer again and they told him the doctor note was okay. Does the patient have any new or worsening symptoms? ---No Disp. Time Lamount Cohen Time) Disposition Final User 05/30/2020 5:41:17 PM Clinical Call Yes [place], RN, Marchelle Folks

## 2020-06-01 NOTE — Telephone Encounter (Signed)
Pt reported in this note that no further letter was needed for work.

## 2020-06-09 ENCOUNTER — Ambulatory Visit (INDEPENDENT_AMBULATORY_CARE_PROVIDER_SITE_OTHER): Payer: 59 | Admitting: Psychology

## 2020-06-09 DIAGNOSIS — F410 Panic disorder [episodic paroxysmal anxiety] without agoraphobia: Secondary | ICD-10-CM | POA: Diagnosis not present

## 2020-06-23 ENCOUNTER — Ambulatory Visit: Payer: 59 | Admitting: Psychology

## 2020-06-24 NOTE — Progress Notes (Signed)
   I, Philbert Riser, LAT, ATC acting as a scribe for [name], MD.  Micheal Ford is a 35 y.o. male who presents to [facility] at [facility] today for muscle pain and headaches. Pt was last seen by Dr. Denyse Amass on 02/27/20 for thoracic and cervical spine pain. Since his last visit, pt reports experiencing pain around the outer area of his head and neck since Sunday w/ no known MOI.  He states that he recently started a new job and wonder is this might be contributing.  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  His head pain is random and sometimes is associated w/ neck/upper trap pain but sometimes not.  He has been taking IBU and using heat.  He had a similar episode of this in October 2021.  He did well with very limited physical therapy and home exercise program.  He feels today's pain is an exacerbation of that pain.  He denies any new injury.  Of note he did contract COVID-19 earlier this month but is feeling much better now.  He was vaccinated but not boosted.   Pertinent review of systems: No fevers or chills cough congestion nausea vomiting diarrhea.  No weakness or numbness.  Relevant historical information: Anxiety, asthma   Exam:  BP (!) 150/100 (BP Location: Right Arm, Patient Position: Sitting, Cuff Size: Normal)   Pulse (!) 103   Ht 5' 7.5" (1.715 m)   Wt 195 lb 6.4 oz (88.6 kg)   SpO2 96%   BMI 30.15 kg/m  General: Well Developed, well nourished, and in no acute distress.   MSK: C-spine normal-appearing Nontender midline.  Tender palpation mildly paraspinal musculature bilaterally. Normal cervical motion. Upper extremity strength sensation and reflexes are intact and equal bilaterally. T-spine normal. Nontender midline. Mildly tender palpation right thoracic paraspinal musculature. Normal scapular motion. Normal gait.    Assessment and Plan: 35 y.o. male with neck pain, thoracic back pain, and a bit of  a headache.  All due to muscle spasm and dysfunction.  Plan for treatment with a bit of rest, home exercise program, and TENS unit heating pad and tizanidine.  Recheck back with me if not improving or consider referral to physical therapy.  Check back as needed.   PDMP not reviewed this encounter. No orders of the defined types were placed in this encounter.  Meds ordered this encounter  Medications  . tiZANidine (ZANAFLEX) 4 MG tablet    Sig: Take 1 tablet (4 mg total) by mouth every 6 (six) hours as needed for muscle spasms.    Dispense:  30 tablet    Refill:  1     Discussed warning signs or symptoms. Please see discharge instructions. Patient expresses understanding.   The above documentation has been reviewed and is accurate and complete [name], M.D.

## 2020-06-25 ENCOUNTER — Encounter: Payer: Self-pay | Admitting: Family Medicine

## 2020-06-25 ENCOUNTER — Other Ambulatory Visit: Payer: Self-pay

## 2020-06-25 ENCOUNTER — Ambulatory Visit (INDEPENDENT_AMBULATORY_CARE_PROVIDER_SITE_OTHER): Payer: 59 | Admitting: Family Medicine

## 2020-06-25 VITALS — BP 150/100 | HR 103 | Ht 67.5 in | Wt 195.4 lb

## 2020-06-25 DIAGNOSIS — M542 Cervicalgia: Secondary | ICD-10-CM

## 2020-06-25 DIAGNOSIS — M546 Pain in thoracic spine: Secondary | ICD-10-CM | POA: Diagnosis not present

## 2020-06-25 MED ORDER — TIZANIDINE HCL 4 MG PO TABS: 4.0000 mg | ORAL_TABLET | Freq: Four times a day (QID) | ORAL | 1 refills | Status: DC | PRN

## 2020-06-25 NOTE — Patient Instructions (Addendum)
Thank you for coming in today.  Plan for home exercises.   Use heating and TENS unit.   Use tizanidine muscle relaxer at bedtime as needed.   If not improving or if worsening let me know and I can refer to PT.  I do not think you will need it.   TENS UNIT: This is helpful for muscle pain and spasm.   Search and Purchase a TENS 7000 2nd edition at  www.tenspros.com or www.Amazon.com It should be less than $30.     TENS unit instructions: Do not shower or bathe with the unit on . Turn the unit off before removing electrodes or batteries . If the electrodes lose stickiness add a drop of water to the electrodes after they are disconnected from the unit and place on plastic sheet. If you continued to have difficulty, call the TENS unit company to purchase more electrodes. . Do not apply lotion on the skin area prior to use. Make sure the skin is clean and dry as this will help prolong the life of the electrodes. . After use, always check skin for unusual red areas, rash or other skin difficulties. If there are any skin problems, does not apply electrodes to the same area. . Never remove the electrodes from the unit by pulling the wires. . Do not use the TENS unit or electrodes other than as directed. . Do not change electrode placement without consultating your therapist or physician. Marland Kitchen Keep 2 fingers with between each electrode. . Wear time ratio is 2:1, on to off times.    For example on for 30 minutes off for 15 minutes and then on for 30 minutes off for 15 minutes

## 2020-07-01 ENCOUNTER — Ambulatory Visit: Payer: 59 | Admitting: Psychology

## 2020-07-07 ENCOUNTER — Ambulatory Visit (INDEPENDENT_AMBULATORY_CARE_PROVIDER_SITE_OTHER): Payer: 59 | Admitting: Psychology

## 2020-07-07 DIAGNOSIS — F410 Panic disorder [episodic paroxysmal anxiety] without agoraphobia: Secondary | ICD-10-CM

## 2020-07-21 ENCOUNTER — Ambulatory Visit (INDEPENDENT_AMBULATORY_CARE_PROVIDER_SITE_OTHER): Payer: 59 | Admitting: Psychology

## 2020-07-21 DIAGNOSIS — F410 Panic disorder [episodic paroxysmal anxiety] without agoraphobia: Secondary | ICD-10-CM | POA: Diagnosis not present

## 2020-08-04 ENCOUNTER — Ambulatory Visit: Payer: 59 | Admitting: Psychology

## 2020-08-09 ENCOUNTER — Other Ambulatory Visit: Payer: Self-pay | Admitting: Allergy & Immunology

## 2020-08-11 ENCOUNTER — Ambulatory Visit (INDEPENDENT_AMBULATORY_CARE_PROVIDER_SITE_OTHER): Payer: 59 | Admitting: Psychology

## 2020-08-11 DIAGNOSIS — F410 Panic disorder [episodic paroxysmal anxiety] without agoraphobia: Secondary | ICD-10-CM

## 2020-08-18 ENCOUNTER — Ambulatory Visit (INDEPENDENT_AMBULATORY_CARE_PROVIDER_SITE_OTHER): Payer: 59 | Admitting: Psychology

## 2020-08-18 DIAGNOSIS — F410 Panic disorder [episodic paroxysmal anxiety] without agoraphobia: Secondary | ICD-10-CM

## 2020-08-20 ENCOUNTER — Other Ambulatory Visit: Payer: Self-pay | Admitting: Allergy & Immunology

## 2020-09-01 ENCOUNTER — Ambulatory Visit: Payer: 59 | Admitting: Psychology

## 2020-09-02 ENCOUNTER — Ambulatory Visit: Payer: 59 | Admitting: Psychology

## 2020-09-15 ENCOUNTER — Ambulatory Visit: Payer: 59 | Admitting: Psychology

## 2020-09-17 ENCOUNTER — Ambulatory Visit (INDEPENDENT_AMBULATORY_CARE_PROVIDER_SITE_OTHER): Payer: 59 | Admitting: Psychology

## 2020-09-17 DIAGNOSIS — F410 Panic disorder [episodic paroxysmal anxiety] without agoraphobia: Secondary | ICD-10-CM | POA: Diagnosis not present

## 2020-09-29 ENCOUNTER — Ambulatory Visit: Payer: 59 | Admitting: Psychology

## 2020-10-06 ENCOUNTER — Ambulatory Visit (INDEPENDENT_AMBULATORY_CARE_PROVIDER_SITE_OTHER): Payer: 59 | Admitting: Psychology

## 2020-10-06 DIAGNOSIS — F410 Panic disorder [episodic paroxysmal anxiety] without agoraphobia: Secondary | ICD-10-CM | POA: Diagnosis not present

## 2020-10-13 ENCOUNTER — Ambulatory Visit: Payer: 59 | Admitting: Psychology

## 2020-10-20 ENCOUNTER — Ambulatory Visit (INDEPENDENT_AMBULATORY_CARE_PROVIDER_SITE_OTHER): Payer: 59 | Admitting: Psychology

## 2020-10-20 DIAGNOSIS — F410 Panic disorder [episodic paroxysmal anxiety] without agoraphobia: Secondary | ICD-10-CM

## 2020-10-26 ENCOUNTER — Ambulatory Visit (INDEPENDENT_AMBULATORY_CARE_PROVIDER_SITE_OTHER): Payer: 59 | Admitting: Family Medicine

## 2020-10-26 ENCOUNTER — Encounter: Payer: Self-pay | Admitting: Family Medicine

## 2020-10-26 ENCOUNTER — Other Ambulatory Visit: Payer: Self-pay

## 2020-10-26 VITALS — BP 120/90 | HR 81 | Temp 97.3°F | Ht 62.0 in | Wt 198.0 lb

## 2020-10-26 DIAGNOSIS — M542 Cervicalgia: Secondary | ICD-10-CM | POA: Diagnosis not present

## 2020-10-26 NOTE — Progress Notes (Signed)
[name] [name], MD, CAQ Sports Medicine  Primary Care and Sports Medicine [facility] at [facility] [address] [place], [address]  Phone: [phone]  FAX: [phone]  [name] - 35 y.o. male  MRN [id number]  Date of Birth: 1985-07-16  Date: 10/26/2020  PCP: [name], MD  Referral: [name], MD  Chief Complaint  Patient presents with  . Sore spot on right side of neck  . Back Pain    Upper Back    This visit occurred during the SARS-CoV-2 public health emergency.  Safety protocols were in place, including screening questions prior to the visit, additional usage of staff PPE, and extensive cleaning of exam room while observing appropriate contact time as indicated for disinfecting solutions.   Subjective:   Micheal Ford is a 35 y.o. very pleasant male patient with Body mass index is 36.21 kg/m. who presents with the following:  Micheal Ford is a very well-known gentleman, and he presents today with some right-sided focal neck pain that is mild in character, and he also thinks he may have some pain in his ear.  It does not hurt to swallow.  He does have some tight muscles in his upper trap as well.  Other than that, he is not had any illness, nauseousness, headache, sinus pain, sore throat, cough, congestion.   Review of Systems is noted in the HPI, as appropriate   Objective:   BP 120/90   Pulse 81   Temp (!) 97.3 F (36.3 C) (Temporal)   Ht 5\' 2"  (1.575 m)   Wt 198 lb (89.8 kg)   SpO2 98%   BMI 36.21 kg/m   GEN: no acute distress. HEENT: Atraumatic, Normocephalic.  Ears and Nose: No external deformity..  Canals are clear.  Nontender with movement of all portions of the ear.  TM is clear, no redness, and all anatomy was visualized easily.  He has no lymphadenopathy. PSYCH: Normally interactive. Conversant.  He does have some modest tenderness at the right scalene muscles, and he also has some modest tenderness in the  upper trap region.  Radiology: No results found.  Assessment and Plan:     ICD-10-CM   1. Neck pain  M54.2    Reassurance, I do not think he has an infection, he has no enlarged lymph nodes, and his exam is relatively benign.  Mild neck pain with some mild trap discomfort as well.  I just doing some basic things with range of motion and decreasing things that induce pain while he is working out would be all the he needs to do to make this go away.   Signed,  . [name], MD   Outpatient Encounter Medications as of 10/26/2020  Medication Sig  . ADVAIR HFA 115-21 MCG/ACT inhaler TAKE 2 PUFFS BY MOUTH TWICE A DAY  . albuterol (PROAIR HFA) 108 (90 Base) MCG/ACT inhaler INHALE 2 PUFFS 4 TIMES A DAY AS NEEDED FOR WHEEZING  . busPIRone (BUSPAR) 10 MG tablet TAKE 1 TABLET BY MOUTH TWICE A DAY  . fluticasone (FLONASE) 50 MCG/ACT nasal spray PLACE 2 SPRAYS INTO THE NOSE DAILY.  . montelukast (SINGULAIR) 10 MG tablet TAKE 1 TABLET BY MOUTH EVERY DAY AS NEEDED  . [DISCONTINUED] Nirmatrelvir & Ritonavir 20 x 150 MG & 10 x 100MG  TBPK TAKE 3 TABLETS BY MOUTH 2 TIMES DAILY FOR 5 DAYS.  . [DISCONTINUED] omeprazole (PRILOSEC) 40 MG capsule Take 40 mg by mouth daily.  . [DISCONTINUED]  tiZANidine (ZANAFLEX) 4 MG tablet Take 1 tablet (4 mg total) by mouth every 6 (six) hours as needed for muscle spasms.   No facility-administered encounter medications on file as of 10/26/2020.

## 2020-10-27 ENCOUNTER — Ambulatory Visit: Payer: 59 | Admitting: Psychology

## 2020-10-29 ENCOUNTER — Other Ambulatory Visit: Payer: Self-pay | Admitting: Family Medicine

## 2020-11-03 ENCOUNTER — Ambulatory Visit (INDEPENDENT_AMBULATORY_CARE_PROVIDER_SITE_OTHER): Payer: 59 | Admitting: Psychology

## 2020-11-03 DIAGNOSIS — F410 Panic disorder [episodic paroxysmal anxiety] without agoraphobia: Secondary | ICD-10-CM | POA: Diagnosis not present

## 2020-11-17 ENCOUNTER — Ambulatory Visit: Payer: 59 | Admitting: Psychology

## 2020-12-01 ENCOUNTER — Ambulatory Visit (INDEPENDENT_AMBULATORY_CARE_PROVIDER_SITE_OTHER): Payer: 59 | Admitting: Psychology

## 2020-12-01 DIAGNOSIS — F410 Panic disorder [episodic paroxysmal anxiety] without agoraphobia: Secondary | ICD-10-CM

## 2020-12-15 ENCOUNTER — Ambulatory Visit (INDEPENDENT_AMBULATORY_CARE_PROVIDER_SITE_OTHER): Payer: 59 | Admitting: Psychology

## 2020-12-15 DIAGNOSIS — F410 Panic disorder [episodic paroxysmal anxiety] without agoraphobia: Secondary | ICD-10-CM | POA: Diagnosis not present

## 2020-12-28 ENCOUNTER — Ambulatory Visit (INDEPENDENT_AMBULATORY_CARE_PROVIDER_SITE_OTHER): Payer: 59 | Admitting: Psychology

## 2020-12-28 DIAGNOSIS — F410 Panic disorder [episodic paroxysmal anxiety] without agoraphobia: Secondary | ICD-10-CM | POA: Diagnosis not present

## 2021-01-12 ENCOUNTER — Ambulatory Visit (INDEPENDENT_AMBULATORY_CARE_PROVIDER_SITE_OTHER): Payer: 59 | Admitting: Psychology

## 2021-01-12 DIAGNOSIS — F410 Panic disorder [episodic paroxysmal anxiety] without agoraphobia: Secondary | ICD-10-CM

## 2021-01-18 NOTE — Progress Notes (Signed)
   I, [name], LAT, ATC, am serving as scribe for Dr. [name].  Micheal Ford is a 35 y.o. male who presents to [facility] at [facility] today for f/u of neck pain and headaches.  He was last seen by Dr. Denyse Amass for similar c/o on 06/25/20 and then by Dr. Karleen Hampshire Copland on 10/26/20 for the same.  Dr. Denyse Amass prescribed him Tizanidine and recommend heat and TENs and some home exercises.  Today, pt reports he had been improving, but over the last 1.5 weeks has pain in B trapz and into T-spine. Pt is no longer taking the tizanidine and has cont to experience HA.    Pertinent review of systems: No fevers or chills  Relevant historical information: Patient works at [employer]   Exam:  BP 138/88   Pulse 77   Ht 5\' 2"  (1.575 m)   Wt 204 lb 9.6 oz (92.8 kg)   SpO2 99%   BMI 37.42 kg/m  General: Well Developed, well nourished, and in no acute distress.   MSK: C-spine normal-appearing Nontender midline tender palpation inferior cervical paraspinal musculature , trapezius, and rhomboids. T-spine nontender midline.  Tender palpation rhomboids bilaterally thoracic paraspinal musculature. Upper extremity strength is intact.    Assessment and Plan: 34 y.o. male with muscular pain bilateral trapezius cervical and thoracic paraspinal musculature and rhomboids bilaterally.  This is a recurrent and intermittent problem.  He is done well in the past with some home exercises which he is already doing.  Plan for referral to physical therapy and refill tizanidine.  Recommend heating pad and percussive massager.  Follow-up with myself or his primary care provider as needed for this.   PDMP not reviewed this encounter. Orders Placed This Encounter  Procedures   Ambulatory referral to Physical Therapy    Referral Priority:   Routine    Referral Type:   Physical Medicine    Referral Reason:   Specialty Services Required    Requested Specialty:   Physical Therapy    Number of  Visits Requested:   1   Meds ordered this encounter  Medications   tiZANidine (ZANAFLEX) 4 MG tablet    Sig: Take 1 tablet (4 mg total) by mouth every 6 (six) hours as needed for muscle spasms.    Dispense:  30 tablet    Refill:  1     Discussed warning signs or symptoms. Please see discharge instructions. Patient expresses understanding.   The above documentation has been reviewed and is accurate and complete 20, M.D.

## 2021-01-19 ENCOUNTER — Ambulatory Visit (INDEPENDENT_AMBULATORY_CARE_PROVIDER_SITE_OTHER): Payer: 59 | Admitting: Family Medicine

## 2021-01-19 ENCOUNTER — Other Ambulatory Visit: Payer: Self-pay

## 2021-01-19 VITALS — BP 138/88 | HR 77 | Ht 62.0 in | Wt 204.6 lb

## 2021-01-19 DIAGNOSIS — M546 Pain in thoracic spine: Secondary | ICD-10-CM

## 2021-01-19 DIAGNOSIS — M542 Cervicalgia: Secondary | ICD-10-CM | POA: Diagnosis not present

## 2021-01-19 MED ORDER — TIZANIDINE HCL 4 MG PO TABS: 4.0000 mg | ORAL_TABLET | Freq: Four times a day (QID) | ORAL | 1 refills | Status: DC | PRN

## 2021-01-19 NOTE — Patient Instructions (Addendum)
Thank you for coming in today.   I've referred you to Physical Therapy.  Let us know if you don't hear from them in one week.   Percussive massage gun can help.   Heating pad may help.   Try the tizanidine muscle relaxer mostly at bedtime as needed.   Return as needed or in 6 weeks.

## 2021-01-23 ENCOUNTER — Other Ambulatory Visit: Payer: Self-pay | Admitting: Family Medicine

## 2021-02-19 ENCOUNTER — Other Ambulatory Visit: Payer: Self-pay | Admitting: Allergy & Immunology

## 2021-03-01 NOTE — Progress Notes (Deleted)
   I, [name], LAT, ATC, am serving as scribe for Dr. [name].  Micheal Ford is a 35 y.o. male who presents to [facility] at [facility] today for f/u of neck, upper trap and upper back pain.  He was last seen by Dr. Denyse Amass on 01/19/21 and was referred to PT at [facility] but has not completed any visits.  He was also prescribed Tizanidine.  Today, pt reports    Pertinent review of systems: ***  Relevant historical information: ***   Exam:  There were no vitals taken for this visit. General: Well Developed, well nourished, and in no acute distress.   MSK: ***    Lab and Radiology Results No results found for this or any previous visit (from the past 72 hour(s)). No results found.     Assessment and Plan: 35 y.o. male with ***   PDMP not reviewed this encounter. No orders of the defined types were placed in this encounter.  No orders of the defined types were placed in this encounter.    Discussed warning signs or symptoms. Please see discharge instructions. Patient expresses understanding.   ***

## 2021-03-02 ENCOUNTER — Ambulatory Visit: Payer: 59 | Admitting: Family Medicine

## 2021-03-03 ENCOUNTER — Ambulatory Visit (INDEPENDENT_AMBULATORY_CARE_PROVIDER_SITE_OTHER): Payer: 59 | Admitting: Family Medicine

## 2021-03-03 ENCOUNTER — Other Ambulatory Visit: Payer: Self-pay

## 2021-03-03 VITALS — BP 130/86 | HR 95 | Ht 62.0 in | Wt 205.6 lb

## 2021-03-03 DIAGNOSIS — M542 Cervicalgia: Secondary | ICD-10-CM | POA: Diagnosis not present

## 2021-03-03 NOTE — Patient Instructions (Signed)
Thank you for coming in today.   Ok to try the chiropractor.   Pt can always be order in the future if needed.   I can refill medicines in th future if needed.

## 2021-03-03 NOTE — Progress Notes (Signed)
   I, Micheal Ford, LAT, ATC acting as a scribe for Micheal Graham, MD.  Micheal Ford is a 35 y.o. male who presents to [facility] at [facility] today for f/u muscular pain bilateral trapezius cervical, thoracic paraspinal musculature, and rhomboids. Pt was last seen by Dr. Denyse Ford on 01/19/21 and tizanidine was refilled, and pt was advised to use a heating pad, percussive massager, and was referred to PT, but has not completed any visits. Today, pt reports he feels a good bit better. Pt reports plans to start seeing a chiro. Pt has been using the heating pad a massager, which he find helpful.   Pertinent review of systems: No fevers or chills  Relevant historical information: GAD.   Exam:  BP 130/86   Pulse 95   Ht 5\' 2"  (1.575 m)   Wt 205 lb 9.6 oz (93.3 kg)   SpO2 98%   BMI 37.60 kg/m  General: Well Developed, well nourished, and in no acute distress.   MSK: C-spine normal-appearing nontender midline to palpation bilateral inferior cervical paraspinal musculature.     Assessment and Plan: 35 y.o. male with bilateral neck and trapezius pain.  Significantly improved with home exercise program time heating pad TENS unit and massage.  Patient never did physical therapy and instead is choosing to pursue chiropractic care which is reasonable.  Certainly PT can be ordered in the future if needed.  Recheck back with me or Dr. 20 as needed in the future as needed.  \ Discussed warning signs or symptoms. Please see discharge instructions. Patient expresses understanding.   The above documentation has been reviewed and is accurate and complete Micheal Ford, M.D.

## 2021-04-30 ENCOUNTER — Other Ambulatory Visit: Payer: Self-pay | Admitting: Family Medicine

## 2021-05-17 ENCOUNTER — Other Ambulatory Visit: Payer: Self-pay | Admitting: Family Medicine

## 2021-09-09 ENCOUNTER — Other Ambulatory Visit: Payer: Self-pay | Admitting: Allergy & Immunology

## 2021-09-15 ENCOUNTER — Telehealth: Payer: Self-pay | Admitting: Family Medicine

## 2021-09-15 MED ORDER — ADVAIR HFA 115-21 MCG/ACT IN AERO: INHALATION_SPRAY | RESPIRATORY_TRACT | 0 refills | Status: DC

## 2021-09-15 NOTE — Telephone Encounter (Signed)
Pt is requesting refill on his inhaler, he has an appointment scheduled Tuesday but only has about 16 puffs left.  ?

## 2021-09-15 NOTE — Telephone Encounter (Signed)
Spoke with Renae Fickle.  He is needing a refill on his Advair.  Refill sent to [facility] as requested.  ?

## 2021-09-22 ENCOUNTER — Ambulatory Visit (INDEPENDENT_AMBULATORY_CARE_PROVIDER_SITE_OTHER): Payer: 59 | Admitting: Family Medicine

## 2021-09-22 ENCOUNTER — Encounter: Payer: Self-pay | Admitting: Family Medicine

## 2021-09-22 VITALS — BP 120/80 | HR 82 | Temp 98.6°F | Ht 62.0 in | Wt 191.4 lb

## 2021-09-22 DIAGNOSIS — F411 Generalized anxiety disorder: Secondary | ICD-10-CM | POA: Diagnosis not present

## 2021-09-22 DIAGNOSIS — J4540 Moderate persistent asthma, uncomplicated: Secondary | ICD-10-CM | POA: Diagnosis not present

## 2021-09-22 MED ORDER — BUSPIRONE HCL 10 MG PO TABS: 10.0000 mg | ORAL_TABLET | Freq: Two times a day (BID) | ORAL | 1 refills | Status: DC

## 2021-09-22 MED ORDER — MONTELUKAST SODIUM 10 MG PO TABS: 10.0000 mg | ORAL_TABLET | Freq: Every day | ORAL | 3 refills | Status: DC

## 2021-09-22 MED ORDER — FLUTICASONE PROPIONATE 50 MCG/ACT NA SUSP: 2.0000 | Freq: Every day | NASAL | 1 refills | Status: DC

## 2021-09-22 MED ORDER — ALBUTEROL SULFATE HFA 108 (90 BASE) MCG/ACT IN AERS: INHALATION_SPRAY | RESPIRATORY_TRACT | 1 refills | Status: AC

## 2021-09-22 MED ORDER — FLUTICASONE-SALMETEROL 115-21 MCG/ACT IN AERO: INHALATION_SPRAY | RESPIRATORY_TRACT | 3 refills | Status: DC

## 2021-09-22 NOTE — Progress Notes (Signed)
? ? ?Micheal Sao T. Mckensie Scotti, MD, CAQ Sports Medicine ?Nature conservation officer at [facility] ?[address] [place] ?[place], [address] ? ?Phone: [phone]  FAX: [phone] ? ?Micheal Ford - 36 y.o. male  MRN [id number]  Date of Birth: [birth year] ? ?Date: 09/22/2021  PCP: [name], MD  Referral: [name], MD ? ?Chief Complaint  ?Patient presents with  ? Medication Refill  ? Follow-up  ?  Discuss refilling medication  ? ? ?This visit occurred during the SARS-CoV-2 public health emergency.  Safety protocols were in place, including screening questions prior to the visit, additional usage of staff PPE, and extensive cleaning of exam room while observing appropriate contact time as indicated for disinfecting solutions.  ? ?Subjective:  ? ?Micheal Ford is a 36 y.o. very pleasant male patient with Body mass index is 35 kg/m?. who presents with the following: ? ?Allergies have been bad.  [place].  He has not wanted to do his allergy shots recently. ? ?I have not seen him in a year, and he is here for follow-up on essentially entirely stable conditions. ? ?He does have controlled allergies, well controlled on Singulair as well as Flonase ? ?He also has some stable well-controlled anxiety on buspirone ?Needs refills on his stable asthma medications ? ?No recent asthma flare, no excessive use of albuterol. ? ?Review of Systems is noted in the HPI, as appropriate ? ?Objective:  ? ?BP 120/80   Pulse 82   Temp 98.6 ?F (37 ?C) (Oral)   Ht 5\' 2"  (1.575 m)   Wt 191 lb 6 oz (86.8 kg)   SpO2 97%   BMI 35.00 kg/m?  ? ?GEN: No acute distress; alert,appropriate. ?PULM: Breathing comfortably in no respiratory distress ?PSYCH: Normally interactive.  ?CV: RRR, no m/g/r  ?PULM: Normal respiratory rate, no accessory muscle use. No wheezes, crackles or rhonchi  ? ?Laboratory and Imaging Data: ? ?Assessment and Plan:  ? ?  ICD-10-CM   ?1. Moderate persistent asthma  J45.40   ?  ?2. GAD (generalized  anxiety disorder)  F41.1   ?  ? ?Stable chronic medical conditions.  Refill all.  No significant ongoing asthma. ? ? ?Medication Management during today's office visit: ? ?New prescription management for evaluated conditions today and refills : ?Meds ordered this encounter  ?Medications  ? busPIRone (BUSPAR) 10 MG tablet  ?  Sig: Take 1 tablet (10 mg total) by mouth 2 (two) times daily.  ?  Dispense:  180 tablet  ?  Refill:  1  ? montelukast (SINGULAIR) 10 MG tablet  ?  Sig: Take 1 tablet (10 mg total) by mouth at bedtime.  ?  Dispense:  90 tablet  ?  Refill:  3  ? fluticasone-salmeterol (ADVAIR HFA) 115-21 MCG/ACT inhaler  ?  Sig: TAKE 2 PUFFS BY MOUTH TWICE A DAY  ?  Dispense:  36 g  ?  Refill:  3  ? albuterol (PROAIR HFA) 108 (90 Base) MCG/ACT inhaler  ?  Sig: INHALE 2 PUFFS 4 TIMES A DAY AS NEEDED FOR WHEEZING  ?  Dispense:  18 g  ?  Refill:  1  ? fluticasone (FLONASE) 50 MCG/ACT nasal spray  ?  Sig: Place 2 sprays into both nostrils daily.  ?  Dispense:  48 mL  ?  Refill:  1  ? ? ?Discontinued medications managed today: ?Medications Discontinued During This Encounter  ?Medication Reason  ? busPIRone (BUSPAR) 10 MG tablet Reorder  ? albuterol (  PROAIR HFA) 108 (90 Base) MCG/ACT inhaler Reorder  ? montelukast (SINGULAIR) 10 MG tablet Reorder  ? fluticasone (FLONASE) 50 MCG/ACT nasal spray Reorder  ? fluticasone-salmeterol (ADVAIR HFA) 115-21 MCG/ACT inhaler Reorder  ? ? ?Orders placed today for conditions managed today: ?No orders of the defined types were placed in this encounter. ? ? ?Follow-up: No follow-ups on file. ? ?Dragon Medical One speech-to-text software was used for transcription in this dictation.  Possible transcriptional errors can occur using Animal nutritionist.  ? ?Signed, ? ?Woodruff Skirvin T. Marguerette Sheller, MD ? ? ?Outpatient Encounter Medications as of 09/22/2021  ?Medication Sig  ? [DISCONTINUED] albuterol (PROAIR HFA) 108 (90 Base) MCG/ACT inhaler INHALE 2 PUFFS 4 TIMES A DAY AS NEEDED FOR WHEEZING  ?  [DISCONTINUED] busPIRone (BUSPAR) 10 MG tablet TAKE ONE TABLET BY MOUTH TWICE A DAY  ? [DISCONTINUED] fluticasone (FLONASE) 50 MCG/ACT nasal spray PLACE 2 SPRAYS INTO THE NOSE DAILY.  ? [DISCONTINUED] fluticasone-salmeterol (ADVAIR HFA) 115-21 MCG/ACT inhaler TAKE 2 PUFFS BY MOUTH TWICE A DAY  ? [DISCONTINUED] montelukast (SINGULAIR) 10 MG tablet TAKE 1 TABLET BY MOUTH EVERY DAY AS NEEDED  ? albuterol (PROAIR HFA) 108 (90 Base) MCG/ACT inhaler INHALE 2 PUFFS 4 TIMES A DAY AS NEEDED FOR WHEEZING  ? busPIRone (BUSPAR) 10 MG tablet Take 1 tablet (10 mg total) by mouth 2 (two) times daily.  ? fluticasone (FLONASE) 50 MCG/ACT nasal spray Place 2 sprays into both nostrils daily.  ? fluticasone-salmeterol (ADVAIR HFA) 115-21 MCG/ACT inhaler TAKE 2 PUFFS BY MOUTH TWICE A DAY  ? montelukast (SINGULAIR) 10 MG tablet Take 1 tablet (10 mg total) by mouth at bedtime.  ? ?No facility-administered encounter medications on file as of 09/22/2021.  ?  ? ? ? ? ?

## 2021-09-24 ENCOUNTER — Encounter: Payer: Self-pay | Admitting: Family Medicine

## 2021-12-06 ENCOUNTER — Encounter: Payer: Self-pay | Admitting: Family Medicine

## 2021-12-06 ENCOUNTER — Ambulatory Visit (INDEPENDENT_AMBULATORY_CARE_PROVIDER_SITE_OTHER): Payer: 59 | Admitting: Family Medicine

## 2021-12-06 VITALS — BP 120/80 | HR 75 | Temp 98.2°F | Ht 62.0 in | Wt 190.5 lb

## 2021-12-06 DIAGNOSIS — N451 Epididymitis: Secondary | ICD-10-CM

## 2021-12-06 MED ORDER — LEVOFLOXACIN 500 MG PO TABS: 500.0000 mg | ORAL_TABLET | Freq: Every day | ORAL | 0 refills | Status: AC

## 2021-12-06 NOTE — Progress Notes (Signed)
    Chidi Shirer T. Delbra Zellars, MD, CAQ Sports Medicine [facility] at [facility] [address] [place], [address]  Phone: [phone]  FAX: [phone]  Micheal Ford - 36 y.o. male  MRN [id number]  Date of Birth: 22-Sep-1985  Date: 12/06/2021  PCP: [name], MD  Referral: [name], MD  Chief Complaint  Patient presents with   Testicle Pain   Flank Pain    Right   Subjective:   Micheal Ford is a 36 y.o. very pleasant male patient with Body mass index is 34.84 kg/m. who presents with the following:  Presents with some testicular pain and flank pain on the right side.  R testicle - pain and not sure.  Some pain at the testicle itself.  All around the same time.   No trauma to testicle.  No STD possible  Pain is basically localized on the back of the testicle.  There is no been mass any kind of mass, cyst, or enlargement at all.  Review of Systems is noted in the HPI, as appropriate  Objective:   BP 120/80   Pulse 75   Temp 98.2 F (36.8 C) (Oral)   Ht 5\' 2"  (1.575 m)   Wt 190 lb 8 oz (86.4 kg)   SpO2 98%   BMI 34.84 kg/m   GEN: No acute distress; alert,appropriate. PULM: Breathing comfortably in no respiratory distress PSYCH: Normally interactive.  GU: Normal phallus.  Left testicle appears normal.  Right testicle has no masses or palpable external abnormalities.  There is some tenderness at the epididymis.  I do not appreciate any hernia.  Laboratory and Imaging Data:  Assessment and Plan:     ICD-10-CM   1. Epididymitis, right  N45.1      History and exam go along with this.  No other risk.  Treat as such.  Explained anatomy.  Medication Management during today's office visit: Meds ordered this encounter  Medications   levofloxacin (LEVAQUIN) 500 MG tablet    Sig: Take 1 tablet (500 mg total) by mouth daily for 10 days.    Dispense:  10 tablet    Refill:  0   There are no discontinued medications.  Orders placed  today for conditions managed today: No orders of the defined types were placed in this encounter.   Follow-up if needed: No follow-ups on file.  Dragon Medical One speech-to-text software was used for transcription in this dictation.  Possible transcriptional errors can occur using .   Signed,  Animal nutritionist. Tiarrah Saville, MD   Outpatient Encounter Medications as of 12/06/2021  Medication Sig   albuterol (PROAIR HFA) 108 (90 Base) MCG/ACT inhaler INHALE 2 PUFFS 4 TIMES A DAY AS NEEDED FOR WHEEZING   busPIRone (BUSPAR) 10 MG tablet Take 1 tablet (10 mg total) by mouth 2 (two) times daily.   fluticasone (FLONASE) 50 MCG/ACT nasal spray Place 2 sprays into both nostrils daily.   fluticasone-salmeterol (ADVAIR HFA) 115-21 MCG/ACT inhaler TAKE 2 PUFFS BY MOUTH TWICE A DAY   levofloxacin (LEVAQUIN) 500 MG tablet Take 1 tablet (500 mg total) by mouth daily for 10 days.   montelukast (SINGULAIR) 10 MG tablet Take 1 tablet (10 mg total) by mouth at bedtime.   No facility-administered encounter medications on file as of 12/06/2021.

## 2021-12-20 ENCOUNTER — Encounter: Payer: Self-pay | Admitting: Family Medicine

## 2021-12-20 ENCOUNTER — Ambulatory Visit (INDEPENDENT_AMBULATORY_CARE_PROVIDER_SITE_OTHER): Payer: 59 | Admitting: Family Medicine

## 2021-12-20 ENCOUNTER — Ambulatory Visit (INDEPENDENT_AMBULATORY_CARE_PROVIDER_SITE_OTHER)
Admission: RE | Admit: 2021-12-20 | Discharge: 2021-12-20 | Disposition: A | Discharge status: Home / Self Care | Payer: 59 | Source: Ambulatory Visit | Attending: Family Medicine | Admitting: Family Medicine

## 2021-12-20 VITALS — BP 130/80 | HR 81 | Temp 97.6°F | Ht 62.0 in | Wt 194.4 lb

## 2021-12-20 DIAGNOSIS — M25561 Pain in right knee: Secondary | ICD-10-CM

## 2021-12-20 MED ORDER — DICLOFENAC SODIUM 75 MG PO TBEC: 75.0000 mg | DELAYED_RELEASE_TABLET | Freq: Two times a day (BID) | ORAL | 1 refills | Status: DC

## 2021-12-20 NOTE — [place] Notes (Signed)
Micheal Aleshire T. Talaysia Pinheiro, MD, CAQ Sports Medicine [facility] at [facility] [address] [place], [address]  Phone: [phone]  FAX: [phone]  Micheal Ford - 36 y.o. male  MRN [id number]  Date of Birth: [birth year]  Date: 12/20/2021  PCP: [name], MD  Referral: [name], MD  Chief Complaint  Patient presents with   Knee Pain    Right with swelling   Subjective:   Micheal Ford is a 36 y.o. very pleasant male patient with Body mass index is 35.55 kg/m. who presents with the following:  Pleasant 36 year old who is known well who presents with a 1 week history of knee pain as well as an effusion.  He has medial and posteromedial knee pain.  He has a moderate-sized effusion and a feeling of restriction of motion in the plane of flexion.  He does do repetitive sitting and squatting tasks at work where he is lifting a lot, but he does not recall a specific injury at all.  He has no major knee history and has no history of prior operative intervention or major trauma.  He has tried resting his knee thus far, that is it.  Mueller knee brace, and this does seem to help take some load off the knee.  Review of Systems is noted in the HPI, as appropriate   Objective:   BP 130/80   Pulse 81   Temp 97.6 F (36.4 C) (Oral)   Ht 5\' 2"  (1.575 m)   Wt 194 lb 6 oz (88.2 kg)   SpO2 97%   BMI 35.55 kg/m   GEN: No acute distress; alert,appropriate. PULM: Breathing comfortably in no respiratory distress PSYCH: Normally interactive.    Right knee: Moderate effusion, ballotable Does have some pain with loading the medial and lateral patellar facets Notable medial joint line tenderness significantly greater than lateral joint line ACL and PCL are intact. Stable to varus and valgus stress. Nontender at the tibial plateau or at the fibular head. Flexion pinch as well as McMurray's induce pain, but there are no mechanical  symptoms.  Radiology: The radiological images were independently reviewed by myself in the office and results were reviewed with the patient. My independent interpretation of images:   X-rays: AP Bilateral Weight-bearing, Weightbearing Lateral, Sunrise views, view Indication: knee pain Findings: There is no fracture or dislocation.  Joint spaces are well-preserved, and there is a mild effusion. Electronically Signed  By: Zoila Shutter, MD On: 12/20/2021 11:00 AM EDT   Assessment and Plan:     ICD-10-CM   1. Acute pain of right knee  M25.561 DG Knee 4 Views W/Patella Right     Meniscal contusion versus derangement.  Joint spaces are well-preserved on plain x-ray.  For now, ice, relative rest, and add scheduled NSAIDs over the next 2 to 3 weeks.  If symptoms persist, we can always do more look further with advanced imaging.  Medication Management during today's office visit: Meds ordered this encounter  Medications   diclofenac (VOLTAREN) 75 MG EC tablet    Sig: Take 1 tablet (75 mg total) by mouth 2 (two) times daily.    Dispense:  60 tablet    Refill:  1   There are no discontinued medications.  Orders placed today for conditions managed today: Orders Placed This Encounter  Procedures   DG Knee 4 Views W/Patella Right    Follow-up if needed: No follow-ups on file.  Dragon [employer] One speech-to-text software  was used for transcription in this dictation.  Possible transcriptional errors can occur using Animal nutritionist.   Signed,  Elpidio Galea. Alonna Bartling, MD   Outpatient Encounter Medications as of 12/20/2021  Medication Sig   albuterol (PROAIR HFA) 108 (90 Base) MCG/ACT inhaler INHALE 2 PUFFS 4 TIMES A DAY AS NEEDED FOR WHEEZING   busPIRone (BUSPAR) 10 MG tablet Take 1 tablet (10 mg total) by mouth 2 (two) times daily.   diclofenac (VOLTAREN) 75 MG EC tablet Take 1 tablet (75 mg total) by mouth 2 (two) times daily.   fluticasone (FLONASE) 50 MCG/ACT nasal spray  Place 2 sprays into both nostrils daily.   fluticasone-salmeterol (ADVAIR HFA) 115-21 MCG/ACT inhaler TAKE 2 PUFFS BY MOUTH TWICE A DAY   montelukast (SINGULAIR) 10 MG tablet Take 1 tablet (10 mg total) by mouth at bedtime.   No facility-administered encounter medications on file as of 12/20/2021.

## 2022-08-08 ENCOUNTER — Other Ambulatory Visit: Payer: Self-pay

## 2022-08-08 ENCOUNTER — Emergency Department: Payer: No Typology Code available for payment source

## 2022-08-08 DIAGNOSIS — S8001XA Contusion of right knee, initial encounter: Secondary | ICD-10-CM | POA: Diagnosis not present

## 2022-08-08 DIAGNOSIS — J4540 Moderate persistent asthma, uncomplicated: Secondary | ICD-10-CM | POA: Insufficient documentation

## 2022-08-08 DIAGNOSIS — S8991XA Unspecified injury of right lower leg, initial encounter: Secondary | ICD-10-CM | POA: Diagnosis present

## 2022-08-08 DIAGNOSIS — Y990 Civilian activity done for income or pay: Secondary | ICD-10-CM | POA: Insufficient documentation

## 2022-08-08 DIAGNOSIS — Z7951 Long term (current) use of inhaled steroids: Secondary | ICD-10-CM | POA: Diagnosis not present

## 2022-08-08 DIAGNOSIS — W208XXA Other cause of strike by thrown, projected or falling object, initial encounter: Secondary | ICD-10-CM | POA: Insufficient documentation

## 2022-08-08 NOTE — ED Triage Notes (Signed)
Pt presents to ER from work with c/o right knee pain.  Pt states something fell off shelf, and landed just below pts right knee.  Pt states pain is 5/10, but states it just doesn't feel great.  Pt is A&O x4, ambulatory to triage, and in NAD.

## 2022-08-09 ENCOUNTER — Telehealth: Payer: Self-pay

## 2022-08-09 ENCOUNTER — Emergency Department
Admission: EM | Admit: 2022-08-09 | Discharge: 2022-08-09 | Disposition: A | Discharge status: Home / Self Care | Payer: No Typology Code available for payment source | Attending: Emergency Medicine | Admitting: Emergency Medicine

## 2022-08-09 DIAGNOSIS — S8001XA Contusion of right knee, initial encounter: Secondary | ICD-10-CM

## 2022-08-09 DIAGNOSIS — S80211A Abrasion, right knee, initial encounter: Secondary | ICD-10-CM

## 2022-08-09 MED ORDER — BACITRACIN ZINC 500 UNIT/GM EX OINT
TOPICAL_OINTMENT | Freq: Once | CUTANEOUS | Status: AC
  Filled 2022-08-09: qty 0.9

## 2022-08-09 NOTE — ED Notes (Signed)
Pt has open wound to R knee.  Quarter size

## 2022-08-09 NOTE — Discharge Instructions (Addendum)
You may alternate Tylenol 1000 mg every 6 hours as needed for pain, fever and Ibuprofen 800 mg every 6-8 hours as needed for pain, fever.  Please take Ibuprofen with food.  Do not take more than 4000 mg of Tylenol (acetaminophen) in a 24 hour period.  You may bear weight as tolerated.  You may clean this wound daily with warm soap and water and a bandage until healed.

## 2022-08-09 NOTE — ED Provider Notes (Signed)
[facility] Provider Note    Event Date/Time   First MD Initiated Contact with Patient 08/09/22 0037     (approximate)   History   Knee Pain   HPI  Micheal Ford is a 37 y.o. male with history of asthma, anxiety who presents to the emergency department with a right knee injury that occurred at work.  States that something fell off of a shelf and hit him in the right knee.  He has been able to ambulate.  No other injury.  Has an abrasion to the knee.  States tetanus vaccine is up-to-date in the past 2 years.   History provided by patient.    Past Medical History:  Diagnosis Date   GAD (generalized anxiety disorder) 11/08/2010   Moderate persistent asthma 09/25/2018   Seasonal and perennial allergic rhinitis 09/25/2018    History reviewed. No pertinent surgical history.  MEDICATIONS:  Prior to Admission medications   Medication Sig Start Date End Date Taking? Authorizing Provider  albuterol (PROAIR HFA) 108 (90 Base) MCG/ACT inhaler INHALE 2 PUFFS 4 TIMES A DAY AS NEEDED FOR WHEEZING 09/22/21   Copland, Frederico Hamman, MD  busPIRone (BUSPAR) 10 MG tablet Take 1 tablet (10 mg total) by mouth 2 (two) times daily. 09/22/21   Copland, Frederico Hamman, MD  diclofenac (VOLTAREN) 75 MG EC tablet Take 1 tablet (75 mg total) by mouth 2 (two) times daily. 12/20/21   Copland, Frederico Hamman, MD  fluticasone (FLONASE) 50 MCG/ACT nasal spray Place 2 sprays into both nostrils daily. 09/22/21   Copland, Frederico Hamman, MD  fluticasone-salmeterol (ADVAIR HFA) 115-21 MCG/ACT inhaler TAKE 2 PUFFS BY MOUTH TWICE A DAY 09/22/21   Copland, Frederico Hamman, MD  montelukast (SINGULAIR) 10 MG tablet Take 1 tablet (10 mg total) by mouth at bedtime. 09/22/21   Owens Loffler, MD    Physical Exam   Triage Vital Signs: ED Triage Vitals  Enc Vitals Group     BP 08/08/22 2112 (!) 146/90     Pulse Rate 08/08/22 2112 90     Resp 08/08/22 2112 16     Temp 08/08/22 2112 97.9 F (36.6 C)     Temp Source 08/08/22 2112 Oral      SpO2 08/08/22 2112 99 %     Weight 08/08/22 2113 195 lb (88.5 kg)     Height 08/08/22 2113 5\' 7"  (1.702 m)     Head Circumference --      Peak Flow --      Pain Score 08/08/22 2113 5     Pain Loc --      Pain Edu? --      Excl. in [place]? --     Most recent vital signs: Vitals:   08/08/22 2112  BP: (!) 146/90  Pulse: 90  Resp: 16  Temp: 97.9 F (36.6 C)  SpO2: 99%     CONSTITUTIONAL: Alert and responds appropriately to questions. Well-appearing; well-nourished HEAD: Normocephalic, atraumatic EYES: Conjunctivae clear, pupils appear equal ENT: normal nose; moist mucous membranes NECK: Normal range of motion CARD: Regular rate and rhythm RESP: Normal chest excursion without splinting or tachypnea; no hypoxia or respiratory distress, speaking full sentences ABD/GI: non-distended EXT: Full range of motion of the right knee.  2+ right DP pulse.  No joint effusion.  Mild soft tissue swelling and a superficial abrasion just below the patella.  No calf tenderness or calf swelling. SKIN: Normal color for age and race, no rashes on exposed skin NEURO: Moves all extremities equally, normal  speech, no facial asymmetry noted PSYCH: The patient's mood and manner are appropriate. Grooming and personal hygiene are appropriate.  ED Results / Procedures / Treatments   LABS: (all labs ordered are listed, but only abnormal results are displayed) Labs Reviewed - No data to display   EKG:    RADIOLOGY: My personal review and interpretation of imaging: X-ray shows some soft tissue swelling but no other acute abnormality.  I have personally reviewed all radiology reports. DG Knee Complete 4 Views Right  Result Date: 08/08/2022 CLINICAL DATA:  Right knee pain. EXAM: RIGHT KNEE - COMPLETE 4+ VIEW COMPARISON:  Right knee radiograph 12/20/2021 FINDINGS: No evidence of fracture, dislocation, or joint effusion. Sclerotic focus in the distal femur likely represents a bone island, stable from prior  exam. No erosive change or bony destruction. Small soft tissue defect and edema in the prepatellar region. IMPRESSION: Soft tissue defect and edema in the prepatellar region. No acute osseous abnormality. Electronically Signed   By: Keith Rake M.D.   On: 08/08/2022 21:53     PROCEDURES:  Critical Care performed:      Procedures    IMPRESSION / MDM / [place] / ED COURSE  I reviewed the triage vital signs and the nursing notes.   Patient here with contusion and abrasion to the knee.     DIFFERENTIAL DIAGNOSIS (includes but not limited to):   Contusion, abrasion, no laceration that needs repair, low suspicion for fracture or dislocation  Patient's presentation is most consistent with acute complicated illness / injury requiring diagnostic workup.  PLAN: X-rays obtained from triage reviewed and interpreted by myself and the radiologist and shows no fracture, dislocation.  Patient neurovascular intact distally and ambulatory.  Tetanus vaccine is up-to-date.  Will clean wound, apply bacitracin and sterile dressing.  There is no indication for sutures.  Recommended Tylenol, Motrin over-the-counter.  Offered pain medication here which she declines.  Will place an Ace wrap.  Will provide with work note for 1 day.  Discussed with patient he is able to ambulate as tolerated and return to full duty.   MEDICATIONS GIVEN IN ED: Medications  bacitracin ointment (has no administration in time range)     ED COURSE:  At this time, I do not feel there is any life-threatening condition present. I reviewed all nursing notes, vitals, pertinent previous records.  All lab and urine results, EKGs, imaging ordered have been independently reviewed and interpreted by myself.  I reviewed all available radiology reports from any imaging ordered this visit.  Based on my assessment, I feel the patient is safe to be discharged home without further emergent workup and can continue workup as an  outpatient as needed. Discussed all findings, treatment plan as well as usual and customary return precautions.  They verbalize understanding and are comfortable with this plan.  Outpatient follow-up has been provided as needed.  All questions have been answered.    CONSULTS:  none   OUTSIDE RECORDS REVIEWED: Reviewed last family medicine note on 12/20/2021.     FINAL CLINICAL IMPRESSION(S) / ED DIAGNOSES   Final diagnoses:  Abrasion of right knee, initial encounter  Contusion of right knee, initial encounter     Rx / DC Orders   ED Discharge Orders     None        Note:  This document was prepared using Dragon voice recognition software and may include unintentional dictation errors.   Hiawatha Dressel, Delice Bison, DO 08/09/22 [phone]

## 2022-08-09 NOTE — ED Notes (Signed)
Pt R knee cleaned, ointment applied and wrapped with ace bandage as ordered by Ward, DO

## 2022-08-09 NOTE — Transitions of Care (Post Inpatient/ED Visit) (Signed)
   08/09/2022  Name: Micheal Ford MRN: [id number] DOB: 30-Sep-1985  Today's TOC FU Call Status: Today's TOC FU Call Status:: Unsuccessul Call (1st Attempt) Unsuccessful Call (1st Attempt) Date: 08/09/22  Attempted to reach the patient regarding the most recent Inpatient/ED visit.  Follow Up Plan: Additional outreach attempts will be made to reach the patient to complete the Transitions of Care (Post Inpatient/ED visit) call.   Signature Glenna Durand LPN [place] Team Direct dial:  [phone]

## 2022-08-14 ENCOUNTER — Other Ambulatory Visit: Payer: Self-pay | Admitting: Family Medicine

## 2022-08-14 NOTE — Telephone Encounter (Signed)
Please schedule CPE with fasting labs prior with Dr. Copland.  

## 2022-08-15 NOTE — Telephone Encounter (Signed)
LVM for patient to call back. ?

## 2022-08-22 ENCOUNTER — Other Ambulatory Visit: Payer: Self-pay | Admitting: Family Medicine

## 2022-08-22 DIAGNOSIS — Z1159 Encounter for screening for other viral diseases: Secondary | ICD-10-CM

## 2022-08-22 DIAGNOSIS — Z131 Encounter for screening for diabetes mellitus: Secondary | ICD-10-CM

## 2022-08-22 DIAGNOSIS — R5383 Other fatigue: Secondary | ICD-10-CM

## 2022-08-22 DIAGNOSIS — Z13220 Encounter for screening for lipoid disorders: Secondary | ICD-10-CM

## 2022-08-24 ENCOUNTER — Other Ambulatory Visit (INDEPENDENT_AMBULATORY_CARE_PROVIDER_SITE_OTHER): Payer: Managed Care, Other (non HMO)

## 2022-08-24 ENCOUNTER — Ambulatory Visit: Payer: 59 | Admitting: Family Medicine

## 2022-08-24 DIAGNOSIS — Z131 Encounter for screening for diabetes mellitus: Secondary | ICD-10-CM

## 2022-08-24 DIAGNOSIS — Z1159 Encounter for screening for other viral diseases: Secondary | ICD-10-CM | POA: Diagnosis not present

## 2022-08-24 DIAGNOSIS — R5383 Other fatigue: Secondary | ICD-10-CM | POA: Diagnosis not present

## 2022-08-24 DIAGNOSIS — Z13220 Encounter for screening for lipoid disorders: Secondary | ICD-10-CM | POA: Diagnosis not present

## 2022-08-24 LAB — HEPATIC FUNCTION PANEL
ALT: 23 U/L (ref 0–53)
AST: 20 U/L (ref 0–37)
Albumin: 4.7 g/dL (ref 3.5–5.2)
Alkaline Phosphatase: 62 U/L (ref 39–117)
Bilirubin, Direct: 0.3 mg/dL (ref 0.0–0.3)
Total Bilirubin: 1.7 mg/dL — ABNORMAL HIGH (ref 0.2–1.2)
Total Protein: 7.1 g/dL (ref 6.0–8.3)

## 2022-08-24 LAB — CBC WITH DIFFERENTIAL/PLATELET
Basophils Absolute: 0 10*3/uL (ref 0.0–0.1)
Basophils Relative: 0.7 % (ref 0.0–3.0)
Eosinophils Absolute: 0.4 10*3/uL (ref 0.0–0.7)
Eosinophils Relative: 5.7 % — ABNORMAL HIGH (ref 0.0–5.0)
HCT: 44 % (ref 39.0–52.0)
Hemoglobin: 14.6 g/dL (ref 13.0–17.0)
Lymphocytes Relative: 24.9 % (ref 12.0–46.0)
Lymphs Abs: 1.7 10*3/uL (ref 0.7–4.0)
MCHC: 33.3 g/dL (ref 30.0–36.0)
MCV: 85.4 fl (ref 78.0–100.0)
Monocytes Absolute: 0.7 10*3/uL (ref 0.1–1.0)
Monocytes Relative: 10.6 % (ref 3.0–12.0)
Neutro Abs: 3.9 10*3/uL (ref 1.4–7.7)
Neutrophils Relative %: 58.1 % (ref 43.0–77.0)
Platelets: 276 10*3/uL (ref 150.0–400.0)
RBC: 5.15 Mil/uL (ref 4.22–5.81)
RDW: 14 % (ref 11.5–15.5)
WBC: 6.7 10*3/uL (ref 4.0–10.5)

## 2022-08-24 LAB — LIPID PANEL
Cholesterol: 199 mg/dL (ref 0–200)
HDL: 37.3 mg/dL — ABNORMAL LOW (ref >39.00)
LDL Cholesterol: 141 mg/dL — ABNORMAL HIGH (ref 0–99)
NonHDL: 161.23
Total CHOL/HDL Ratio: 5
Triglycerides: 102 mg/dL (ref 0.0–149.0)
VLDL: 20.4 mg/dL (ref 0.0–40.0)

## 2022-08-24 LAB — BASIC METABOLIC PANEL
BUN: 9 mg/dL (ref 6–23)
CO2: 32 mEq/L (ref 19–32)
Calcium: 9.3 mg/dL (ref 8.4–10.5)
Chloride: 103 mEq/L (ref 96–112)
Creatinine, Ser: 1.12 mg/dL (ref 0.40–1.50)
GFR: 85.03 mL/min (ref >60.00)
Glucose, Bld: 95 mg/dL (ref 70–99)
Potassium: 3.6 mEq/L (ref 3.5–5.1)
Sodium: 140 mEq/L (ref 135–145)

## 2022-08-24 LAB — HEMOGLOBIN A1C: Hgb A1c MFr Bld: 5.6 % (ref 4.6–6.5)

## 2022-08-25 LAB — HEPATITIS C ANTIBODY: Hepatitis C Ab: NONREACTIVE

## 2022-08-30 NOTE — [place] Notes (Signed)
Tymeir Weathington T. Soma Bachand, MD, CAQ Sports Medicine [facility] [address] [place], [address]  Phone: [phone]  FAX: [phone]  TAIVEN FAUPEL - 37 y.o. male  MRN [id number]  Date of Birth: 1985/12/26  Date: 08/31/2022  PCP: [name], MD  Referral: [name], MD  Chief Complaint  Patient presents with   Annual Exam   Patient Care Team: [name], MD as PCP - General (Family Medicine) Subjective:   Micheal Ford is a 37 y.o. pleasant patient who presents with the following:  Preventative Health Maintenance Visit:  Health Maintenance Summary Reviewed and updated, unless pt declines services.  Tobacco History Reviewed. Alcohol: No concerns, no excessive use - none Exercise Habits: gym 4 times a day STD concerns: no risk or activity to increase risk Drug Use: None  Tdap vaccine - has the same scheduled day off, will hold up for now.  He does not want to do it today. Covid booster  Working at [employer] for now - doing that for about a year.  Using Advair regualrly - rare albuterol.   Abrasion on knee - remove bandage.   Some caffeine in the afternoon   Health Maintenance  Topic Date Due   COVID-19 Vaccine (3 - Pfizer risk series) 10/08/2019   INFLUENZA VACCINE  12/22/2022   Hepatitis C Screening  Completed   HIV Screening  Completed   HPV VACCINES  Aged Out   DTaP/Tdap/Td  Discontinued   Immunization History  Administered Date(s) Administered   Influenza,inj,Quad PF,6+ Mos 02/26/2018   PFIZER(Purple Top)SARS-COV-2 Vaccination 08/15/2019, 09/10/2019   Patient Active Problem List   Diagnosis Date Noted   Seasonal and perennial allergic rhinitis 09/25/2018   Moderate persistent asthma 09/25/2018   GAD (generalized anxiety disorder) 11/08/2010    Past Medical History:  Diagnosis Date   GAD (generalized anxiety disorder) 11/08/2010   Moderate persistent asthma 09/25/2018   Seasonal and perennial  allergic rhinitis 09/25/2018    History reviewed. No pertinent surgical history.  History reviewed. No pertinent family history.  Social History   Social History Narrative   Not on file    Past Medical History, Surgical History, Social History, Family History, Problem List, Medications, and Allergies have been reviewed and updated if relevant.  Review of Systems: Pertinent positives are listed above.  Otherwise, a full 14 point review of systems has been done in full and it is negative except where it is noted positive.  Objective:   BP 120/80   Pulse 80   Temp 98.1 F (36.7 C) (Temporal)   Ht 5\' 8"  (1.727 m)   Wt 200 lb (90.7 kg)   SpO2 97%   BMI 30.41 kg/m  Ideal Body Weight: Weight in (lb) to have BMI = 25: 164.1  Ideal Body Weight: Weight in (lb) to have BMI = 25: 164.1 No results found.    08/31/2022   11:47 AM 03/25/2020    8:23 AM 12/19/2018    2:32 PM 04/17/2018   10:55 AM  Depression screen PHQ 2/9  Decreased Interest 0 0 0 0  Down, Depressed, Hopeless 0 0 0 0  PHQ - 2 Score 0 0 0 0     GEN: well developed, well nourished, no acute distress Eyes: conjunctiva and lids normal, PERRLA, EOMI ENT: TM clear, nares clear, oral exam WNL Neck: supple, no lymphadenopathy, no thyromegaly, no JVD Pulm: clear to auscultation and percussion, respiratory effort normal CV: regular rate and rhythm,  S1-S2, no murmur, rub or gallop, no bruits, peripheral pulses normal and symmetric, no cyanosis, clubbing, edema or varicosities GI: soft, non-tender; no hepatosplenomegaly, masses; active bowel sounds all quadrants GU: deferred Lymph: no cervical, axillary or inguinal adenopathy MSK: gait normal, muscle tone and strength WNL, no joint swelling, effusions, discoloration, crepitus  SKIN: clear, good turgor, color WNL, no rashes, lesions, or ulcerations Neuro: normal mental status, normal strength, sensation, and motion Psych: alert; oriented to person, place and time, normally  interactive and not anxious or depressed in appearance.  All labs reviewed with patient. Results for orders placed or performed in visit on 08/24/22  Hepatitis C antibody  Result Value Ref Range   Hepatitis C Ab NON-REACTIVE NON-REACTIVE  Lipid panel  Result Value Ref Range   Cholesterol 199 0 - 200 mg/dL   Triglycerides 960.4 0.0 - 149.0 mg/dL   HDL 54.09 (L) >81.19 mg/dL   VLDL 14.7 0.0 - 82.9 mg/dL   LDL Cholesterol 562 (H) 0 - 99 mg/dL   Total CHOL/HDL Ratio 5    NonHDL 161.23   Hemoglobin A1c  Result Value Ref Range   Hgb A1c MFr Bld 5.6 4.6 - 6.5 %  Hepatic function panel  Result Value Ref Range   Total Bilirubin 1.7 (H) 0.2 - 1.2 mg/dL   Bilirubin, Direct 0.3 0.0 - 0.3 mg/dL   Alkaline Phosphatase 62 39 - 117 U/L   AST 20 0 - 37 U/L   ALT 23 0 - 53 U/L   Total Protein 7.1 6.0 - 8.3 g/dL   Albumin 4.7 3.5 - 5.2 g/dL  CBC with Differential/Platelet  Result Value Ref Range   WBC 6.7 4.0 - 10.5 K/uL   RBC 5.15 4.22 - 5.81 Mil/uL   Hemoglobin 14.6 13.0 - 17.0 g/dL   HCT 13.0 86.5 - 78.4 %   MCV 85.4 78.0 - 100.0 fl   MCHC 33.3 30.0 - 36.0 g/dL   RDW 69.6 29.5 - 28.4 %   Platelets 276.0 150.0 - 400.0 K/uL   Neutrophils Relative % 58.1 43.0 - 77.0 %   Lymphocytes Relative 24.9 12.0 - 46.0 %   Monocytes Relative 10.6 3.0 - 12.0 %   Eosinophils Relative 5.7 (H) 0.0 - 5.0 %   Basophils Relative 0.7 0.0 - 3.0 %   Neutro Abs 3.9 1.4 - 7.7 K/uL   Lymphs Abs 1.7 0.7 - 4.0 K/uL   Monocytes Absolute 0.7 0.1 - 1.0 K/uL   Eosinophils Absolute 0.4 0.0 - 0.7 K/uL   Basophils Absolute 0.0 0.0 - 0.1 K/uL  Basic metabolic panel  Result Value Ref Range   Sodium 140 135 - 145 mEq/L   Potassium 3.6 3.5 - 5.1 mEq/L   Chloride 103 96 - 112 mEq/L   CO2 32 19 - 32 mEq/L   Glucose, Bld 95 70 - 99 mg/dL   BUN 9 6 - 23 mg/dL   Creatinine, Ser 1.32 0.40 - 1.50 mg/dL   GFR 44.01 >02.72 mL/min   Calcium 9.3 8.4 - 10.5 mg/dL    Assessment and Plan:     ICD-10-CM   1. Healthcare  maintenance  Z00.00      Globally, he is doing well.  Borderline cholesterol, he is going to work on his diet and continue to eat healthy and exercise.  Dyrell is otherwise doing well.  He will follow-up in 1 year.  Health Maintenance Exam: The patient's preventative maintenance and recommended screening tests for an annual wellness exam were reviewed in  full today. Brought up to date unless services declined.  Counselled on the importance of diet, exercise, and its role in overall health and mortality. The patient's FH and SH was reviewed, including their home life, tobacco status, and drug and alcohol status.  Follow-up in 1 year for physical exam or additional follow-up below.  Disposition: No follow-ups on file.  No orders of the defined types were placed in this encounter.  Medications Discontinued During This Encounter  Medication Reason   diclofenac (VOLTAREN) 75 MG EC tablet Completed Course   No orders of the defined types were placed in this encounter.   Signed,  Elpidio Galea. Roselle Norton, MD   Allergies as of 08/31/2022   No Known Allergies      Medication List        Accurate as of August 31, 2022  4:34 PM. If you have any questions, ask your nurse or doctor.          STOP taking these medications    diclofenac 75 MG EC tablet Commonly known as: VOLTAREN Stopped by: [name], MD       TAKE these medications    albuterol 108 (90 Base) MCG/ACT inhaler Commonly known as: ProAir HFA INHALE 2 PUFFS 4 TIMES A DAY AS NEEDED FOR WHEEZING   busPIRone 10 MG tablet Commonly known as: BUSPAR TAKE ONE TABLET BY MOUTH TWICE A DAY   fluticasone 50 MCG/ACT nasal spray Commonly known as: FLONASE Place 2 sprays into both nostrils daily.   fluticasone-salmeterol 115-21 MCG/ACT inhaler Commonly known as: Advair HFA TAKE 2 PUFFS BY MOUTH TWICE A DAY   montelukast 10 MG tablet Commonly known as: SINGULAIR Take 1 tablet (10 mg total) by mouth at bedtime.

## 2022-08-31 ENCOUNTER — Ambulatory Visit: Payer: Managed Care, Other (non HMO) | Admitting: Family Medicine

## 2022-08-31 ENCOUNTER — Encounter: Payer: Self-pay | Admitting: Family Medicine

## 2022-08-31 VITALS — BP 120/80 | HR 80 | Temp 98.1°F | Ht 68.0 in | Wt 200.0 lb

## 2022-08-31 DIAGNOSIS — Z0000 Encounter for general adult medical examination without abnormal findings: Secondary | ICD-10-CM | POA: Diagnosis not present

## 2022-09-17 ENCOUNTER — Other Ambulatory Visit: Payer: Self-pay | Admitting: Family Medicine

## 2022-09-30 ENCOUNTER — Other Ambulatory Visit: Payer: Self-pay | Admitting: Family Medicine

## 2022-10-30 NOTE — [place] Notes (Unsigned)
    Derricka Mertz T. Daniesha Driver, MD, CAQ Sports Medicine [facility] at [facility] [address] [place], [address]  Phone: [phone]  FAX: [phone]  Micheal Ford - 37 y.o. male  MRN [id number]  Date of Birth: [birth year]  Date: 10/31/2022  PCP: [name], MD  Referral: [name], MD  No chief complaint on file.  Subjective:   Micheal Ford is a 37 y.o. very pleasant male patient with There is no height or weight on file to calculate BMI. who presents with the following:  He is a pleasant gentleman presents with ongoing ear pain with concern of possible ear infection.    Review of Systems is noted in the HPI, as appropriate  Objective:   There were no vitals taken for this visit.  GEN: No acute distress; alert,appropriate. PULM: Breathing comfortably in no respiratory distress PSYCH: Normally interactive.   Laboratory and Imaging Data:  Assessment and Plan:   ***

## 2022-10-31 ENCOUNTER — Encounter: Payer: Self-pay | Admitting: Family Medicine

## 2022-10-31 ENCOUNTER — Ambulatory Visit: Payer: Managed Care, Other (non HMO) | Admitting: Family Medicine

## 2022-10-31 VITALS — BP 110/70 | HR 72 | Temp 97.9°F | Ht 68.0 in | Wt 197.2 lb

## 2022-10-31 DIAGNOSIS — Z23 Encounter for immunization: Secondary | ICD-10-CM | POA: Diagnosis not present

## 2022-10-31 DIAGNOSIS — H60392 Other infective otitis externa, left ear: Secondary | ICD-10-CM

## 2022-11-03 ENCOUNTER — Other Ambulatory Visit: Payer: Self-pay | Admitting: Family Medicine

## 2022-11-19 ENCOUNTER — Other Ambulatory Visit: Payer: Self-pay | Admitting: Family Medicine

## 2023-01-15 NOTE — [place] Notes (Unsigned)
    Micheal Ford T. Micheal Dede, MD, CAQ Sports Medicine [facility] at [facility] [address] [place], [address]  Phone: [phone]  FAX: [phone]  Micheal Ford - 37 y.o. male  MRN [id number]  Date of Birth: 1985-08-31  Date: 01/16/2023  PCP: [name], MD  Referral: [name], MD  No chief complaint on file.  Subjective:   Micheal Ford is a 37 y.o. very pleasant male patient with There is no height or weight on file to calculate BMI. who presents with the following:  Micheal Ford is a very well-known patient, known for many years.  He presents with some ongoing abdominal pain and back pain.    Review of Systems is noted in the HPI, as appropriate  Objective:   There were no vitals taken for this visit.  GEN: No acute distress; alert,appropriate. PULM: Breathing comfortably in no respiratory distress PSYCH: Normally interactive.   Laboratory and Imaging Data:  Assessment and Plan:   ***

## 2023-01-16 ENCOUNTER — Ambulatory Visit: Payer: Managed Care, Other (non HMO) | Admitting: Family Medicine

## 2023-01-16 ENCOUNTER — Encounter: Payer: Self-pay | Admitting: Family Medicine

## 2023-01-16 VITALS — BP 100/74 | HR 69 | Temp 98.1°F | Ht 68.0 in | Wt 195.1 lb

## 2023-01-16 DIAGNOSIS — R3915 Urgency of urination: Secondary | ICD-10-CM | POA: Diagnosis not present

## 2023-01-16 DIAGNOSIS — R109 Unspecified abdominal pain: Secondary | ICD-10-CM

## 2023-01-16 DIAGNOSIS — R1084 Generalized abdominal pain: Secondary | ICD-10-CM | POA: Diagnosis not present

## 2023-01-17 LAB — POC URINALSYSI DIPSTICK (AUTOMATED)
Bilirubin, UA: NEGATIVE
Blood, UA: NEGATIVE
Glucose, UA: NEGATIVE
Ketones, UA: NEGATIVE
Leukocytes, UA: NEGATIVE
Nitrite, UA: NEGATIVE
Protein, UA: NEGATIVE
Spec Grav, UA: 1.01 (ref 1.010–1.025)
Urobilinogen, UA: 0.2 E.U./dL
pH, UA: 6.5 (ref 5.0–8.0)

## 2023-01-17 NOTE — Addendum Note (Signed)
Addended by: Damita Lack on: 01/17/2023 02:53 PM   Modules accepted: Orders

## 2023-05-02 NOTE — [place] Notes (Unsigned)
    Denney Shein T. Aniyla Harling, MD, CAQ Sports Medicine [facility] at [facility] [address] [place], [address]  Phone: [phone]  FAX: [phone]  CHANZE VANEGAS - 37 y.o. male  MRN [id number]  Date of Birth: February 10, 1986  Date: 05/04/2023  PCP: [name], MD  Referral: [name], MD  No chief complaint on file.  Subjective:   HERSHAL MACHON is a 37 y.o. very pleasant male patient with There is no height or weight on file to calculate BMI. who presents with the following:  Zeppelin is a well-known gentleman, he presents with an acute rash.  Review of Systems is noted in the HPI, as appropriate  Objective:   There were no vitals taken for this visit.  GEN: No acute distress; alert,appropriate. PULM: Breathing comfortably in no respiratory distress PSYCH: Normally interactive.   Laboratory and Imaging Data:  Assessment and Plan:   ***

## 2023-05-04 ENCOUNTER — Encounter: Payer: Self-pay | Admitting: Family Medicine

## 2023-05-04 ENCOUNTER — Ambulatory Visit: Payer: Managed Care, Other (non HMO) | Admitting: Family Medicine

## 2023-05-04 VITALS — BP 120/80 | HR 85 | Temp 97.9°F | Ht 68.0 in | Wt 197.1 lb

## 2023-05-04 DIAGNOSIS — B029 Zoster without complications: Secondary | ICD-10-CM | POA: Diagnosis not present

## 2023-06-27 ENCOUNTER — Other Ambulatory Visit: Payer: Self-pay | Admitting: Family Medicine

## 2023-08-10 ENCOUNTER — Ambulatory Visit: Payer: Self-pay | Admitting: Family Medicine

## 2023-08-10 ENCOUNTER — Ambulatory Visit: Admitting: Family Medicine

## 2023-08-10 VITALS — BP 102/62 | HR 101 | Temp 98.2°F | Ht 68.0 in | Wt 194.2 lb

## 2023-08-10 DIAGNOSIS — K648 Other hemorrhoids: Secondary | ICD-10-CM | POA: Diagnosis not present

## 2023-08-10 MED ORDER — HYDROCORTISONE ACETATE 25 MG RE SUPP: 25.0000 mg | Freq: Two times a day (BID) | RECTAL | 0 refills | Status: DC

## 2023-08-10 NOTE — Telephone Encounter (Signed)
  Chief Complaint: blood in stool Symptoms: blood in stool, constipation, diarrhea Frequency: began yesterday Pertinent Negatives: Patient denies blood thinner, abd pain Disposition: [] ED /[] Urgent Care (no appt availability in office) / [x] Appointment(In office/virtual)/ []  [facility]/ [] Home Care/ [] Refused Recommended Disposition /[] [facility]/ []  Follow-up with PCP Additional Notes: Patient calls reporting two episodes of blood in stool yesterday. Patient states he had some constipation and took OTC medication, then experienced diarrhea. Patient states it was minimal amounts. Per protocol, patient to be evaluated within 24 hours. First available appointment with PCP today at 1540. Patient scheduled. Care advice reviewed, patient verbalized understanding and denies further questions at this time. Alerting PCP for review.    Copied from CRM [id number]. Topic: Clinical - Red Word Triage >> Aug 10, 2023  9:23 AM Pascal Lux wrote: Red Word that prompted transfer to Nurse Triage: Patient called stated last night he saw blood in stool. Reason for Disposition  MODERATE rectal bleeding (small blood clots, passing blood without stool, or toilet water turns red)  Answer Assessment - Initial Assessment Questions 1. APPEARANCE of BLOOD: "What color is it?" "Is it passed separately, on the surface of the stool, or mixed in with the stool?"      Bright red, mixed in the first time, mostly blood the second time. 2. AMOUNT: "How much blood was passed?"      Minimal, "not a lot but some" 3. FREQUENCY: "How many times has blood been passed with the stools?"      Two times 4. ONSET: "When was the blood first seen in the stools?" (Days or weeks)      Last night 5. DIARRHEA: "Is there also some diarrhea?" If Yes, ask: "How many diarrhea stools in the past 24 hours?"      Yes, one episode 6. CONSTIPATION: "Do you have constipation?" If Yes, ask: "How bad is it?"     Yes, took medication  last night. 7. RECURRENT SYMPTOMS: "Have you had blood in your stools before?" If Yes, ask: "When was the last time?" and "What happened that time?"      When wiping too hard 8. BLOOD THINNERS: "Do you take any blood thinners?" (e.g., Coumadin/warfarin, Pradaxa/dabigatran, aspirin)     Denies 9. OTHER SYMPTOMS: "Do you have any other symptoms?"  (e.g., abdomen pain, vomiting, dizziness, fever)     Tingly and soreness around rectum  Protocols used: Rectal Bleeding-A-AH

## 2023-08-10 NOTE — Progress Notes (Unsigned)
   Micheal Marin T. Takeela Peil, MD, CAQ Sports Medicine [facility] at [facility] [address] [place], [address]  Phone: [phone]  FAX: [phone]  Micheal Ford - 38 y.o. male  MRN [id number]  Date of Birth: 1985-11-27  Date: 08/10/2023  PCP: Micheal Beat, MD  Referral: Micheal Beat, MD  Chief Complaint  Patient presents with   Blood In Stools   Rectal Pain   Subjective:   Micheal Ford is a 38 y.o. very pleasant male patient with Body mass index is 29.54 kg/m. who presents with the following:  Was having some constipation, took some miralax.  A couple of drops of stool came out and there was some blood. Every since then, in the afternoon, was having some pain posteriorly  Concerned that possible hemorrhoids  Never had anything like that before    Review of Systems is noted in the HPI, as appropriate  Objective:   BP 102/62 (BP Location: Right Arm, Patient Position: Sitting, Cuff Size: Large)   Pulse (!) 101   Temp 98.2 F (36.8 C) (Temporal)   Ht 5\' 8"  (1.727 m)   Wt 194 lb 4 oz (88.1 kg)   SpO2 99%   BMI 29.54 kg/m   GEN: No acute distress; alert,appropriate. PULM: Breathing comfortably in no respiratory distress PSYCH: Normally interactive.   Laboratory and Imaging Data:  Assessment and Plan:   ***

## 2023-08-11 ENCOUNTER — Encounter: Payer: Self-pay | Admitting: Family Medicine

## 2023-09-24 ENCOUNTER — Other Ambulatory Visit: Payer: Self-pay | Admitting: Family Medicine

## 2023-10-17 ENCOUNTER — Other Ambulatory Visit: Payer: Self-pay | Admitting: Family Medicine

## 2023-10-17 NOTE — Telephone Encounter (Signed)
Please call and schedule CPE with fasting labs prior with Dr. Copland.  

## 2023-10-18 ENCOUNTER — Telehealth: Payer: Self-pay | Admitting: *Deleted

## 2023-10-18 ENCOUNTER — Other Ambulatory Visit: Payer: Self-pay | Admitting: Family Medicine

## 2023-10-18 DIAGNOSIS — Z131 Encounter for screening for diabetes mellitus: Secondary | ICD-10-CM

## 2023-10-18 DIAGNOSIS — R5383 Other fatigue: Secondary | ICD-10-CM

## 2023-10-18 DIAGNOSIS — Z13220 Encounter for screening for lipoid disorders: Secondary | ICD-10-CM

## 2023-10-18 NOTE — Telephone Encounter (Signed)
 [email or web address]

## 2023-10-18 NOTE — Telephone Encounter (Signed)
 Copied from CRM [id number]. Topic: Clinical - Request for Lab/Test Order >> Oct 18, 2023  9:19 AM Clydene Darner H wrote: Reason for CRM: Patient is already scheduled for his (CPE) on June 4. Upon chart review, no routine future lab orders are currently placed.

## 2023-10-18 NOTE — Telephone Encounter (Signed)
lvm for pt to call office to reschedule appt

## 2023-10-18 NOTE — Telephone Encounter (Signed)
 Lab appointment scheduled 10/24/2023 at 12:45 pm.

## 2023-10-18 NOTE — Telephone Encounter (Signed)
 Lab orders in Epic.  Please call and schedule fasting lab appointment prior to his CPE on 10/25/2023.

## 2023-10-19 NOTE — Telephone Encounter (Signed)
 lvm for pt to call office to schedule appt cpe

## 2023-10-20 NOTE — Telephone Encounter (Signed)
 lvm for pt to call office to schedule appt.

## 2023-10-24 ENCOUNTER — Other Ambulatory Visit (INDEPENDENT_AMBULATORY_CARE_PROVIDER_SITE_OTHER)

## 2023-10-24 DIAGNOSIS — Z131 Encounter for screening for diabetes mellitus: Secondary | ICD-10-CM

## 2023-10-24 DIAGNOSIS — Z13220 Encounter for screening for lipoid disorders: Secondary | ICD-10-CM | POA: Diagnosis not present

## 2023-10-24 DIAGNOSIS — R5383 Other fatigue: Secondary | ICD-10-CM

## 2023-10-24 NOTE — Progress Notes (Unsigned)
 Micheal Ford T. Micheal Landstrom, MD, CAQ Sports Medicine [facility] at [facility] [address] [place], [address]  Phone: [phone]  FAX: [phone]  Micheal Ford - 38 y.o. male  MRN [id number]  Date of Birth: 1986-04-12  Date: 10/25/2023  PCP: Scherrie Curt, MD  Referral: Scherrie Curt, MD  No chief complaint on file.  Patient Care Team: Scherrie Curt, MD as PCP - General (Family Medicine) Subjective:   Micheal Ford is a 38 y.o. pleasant patient who presents with the following:  Preventative Health Maintenance Visit:  Health Maintenance Summary Reviewed and updated, unless pt declines services.  Tobacco History Reviewed. Alcohol: No concerns, no excessive use Exercise Habits: Some activity, rec at least 30 mins 5 times a week STD concerns: no risk or activity to increase risk Drug Use: None  Prevnar-20 Covid booster    Health Maintenance  Topic Date Due   Pneumococcal Vaccine 56-21 Years old (1 of 2 - PCV) Never done   COVID-19 Vaccine (3 - 2024-25 season) 01/22/2023   INFLUENZA VACCINE  12/22/2023   Hepatitis C Screening  Completed   HIV Screening  Completed   HPV VACCINES  Aged Out   Meningococcal B Vaccine  Aged Out   DTaP/Tdap/Td  Discontinued   Immunization History  Administered Date(s) Administered   Influenza,inj,Quad PF,6+ Mos 02/26/2018   PFIZER(Purple Top)SARS-COV-2 Vaccination 08/15/2019, 09/10/2019   Tdap 10/31/2022   Patient Active Problem List   Diagnosis Date Noted   Seasonal and perennial allergic rhinitis 09/25/2018   Moderate persistent asthma 09/25/2018   GAD (generalized anxiety disorder) 11/08/2010    Past Medical History:  Diagnosis Date   GAD (generalized anxiety disorder) 11/08/2010   Moderate persistent asthma 09/25/2018   Seasonal and perennial allergic rhinitis 09/25/2018    No past surgical history on file.  No family history on file.  Social History   Social History Narrative   Not on  file    Past Medical History, Surgical History, Social History, Family History, Problem List, Medications, and Allergies have been reviewed and updated if relevant.  Review of Systems: Pertinent positives are listed above.  Otherwise, a full 14 point review of systems has been done in full and it is negative except where it is noted positive.  Objective:   There were no vitals taken for this visit. Ideal Body Weight:    Ideal Body Weight:   No results found.    10/31/2022   11:17 AM 08/31/2022   11:47 AM 03/25/2020    8:23 AM 12/19/2018    2:32 PM 04/17/2018   10:55 AM  Depression screen PHQ 2/9  Decreased Interest 0 0 0 0 0  Down, Depressed, Hopeless 0 0 0 0 0  PHQ - 2 Score 0 0 0 0 0  Altered sleeping 0      Tired, decreased energy 0      Change in appetite 0      Feeling bad or failure about yourself  0      Trouble concentrating 0      Moving slowly or fidgety/restless 0      Suicidal thoughts 0      PHQ-9 Score 0      Difficult doing work/chores Not difficult at all         GEN: well developed, well nourished, no acute distress Eyes: conjunctiva and lids normal, PERRLA, EOMI ENT: TM clear, nares clear, oral exam WNL Neck: supple, no lymphadenopathy, no thyromegaly, no  JVD Pulm: clear to auscultation and percussion, respiratory effort normal CV: regular rate and rhythm, S1-S2, no murmur, rub or gallop, no bruits, peripheral pulses normal and symmetric, no cyanosis, clubbing, edema or varicosities GI: soft, non-tender; no hepatosplenomegaly, masses; active bowel sounds all quadrants GU: deferred Lymph: no cervical, axillary or inguinal adenopathy MSK: gait normal, muscle tone and strength WNL, no joint swelling, effusions, discoloration, crepitus  SKIN: clear, good turgor, color WNL, no rashes, lesions, or ulcerations Neuro: normal mental status, normal strength, sensation, and motion Psych: alert; oriented to person, place and time, normally interactive and not  anxious or depressed in appearance.  All labs reviewed with patient. Results for orders placed or performed in visit on 01/16/23  POCT Urinalysis Dipstick (Automated)   Collection Time: 01/17/23  2:52 PM  Result Value Ref Range   Color, UA Yellow    Clarity, UA Clear    Glucose, UA Negative Negative   Bilirubin, UA Negative    Ketones, UA Negative    Spec Grav, UA 1.010 1.010 - 1.025   Blood, UA Negative    pH, UA 6.5 5.0 - 8.0   Protein, UA Negative Negative   Urobilinogen, UA 0.2 0.2 or 1.0 E.U./dL   Nitrite, UA Negative    Leukocytes, UA Negative Negative    Assessment and Plan:     ICD-10-CM   1. Healthcare maintenance  Z00.00       Health Maintenance Exam: The patient's preventative maintenance and recommended screening tests for an annual wellness exam were reviewed in full today. Brought up to date unless services declined.  Counselled on the importance of diet, exercise, and its role in overall health and mortality. The patient's FH and SH was reviewed, including their home life, tobacco status, and drug and alcohol status.  Follow-up in 1 year for physical exam or additional follow-up below.  Disposition: No follow-ups on file.  No orders of the defined types were placed in this encounter.  There are no discontinued medications. No orders of the defined types were placed in this encounter.   Signed,  Ranny Bye. Danyetta Gillham, MD   Allergies as of 10/25/2023   No Known Allergies      Medication List        Accurate as of October 24, 2023  2:36 PM. If you have any questions, ask your nurse or doctor.          albuterol  108 (90 Base) MCG/ACT inhaler Commonly known as: ProAir  HFA INHALE 2 PUFFS 4 TIMES A DAY AS NEEDED FOR WHEEZING   busPIRone  10 MG tablet Commonly known as: BUSPAR  TAKE 1 TABLET BY MOUTH 2 TIMES A DAY   fluticasone  50 MCG/ACT nasal spray Commonly known as: FLONASE  SPRAY TWO SPRAYS IN EACH NOSTRIL ONCE DAILY   fluticasone -salmeterol  115-21 MCG/ACT inhaler Commonly known as: Advair  HFA INHALE TWO PUFFS BY MOUTH TWICE A DAY   hydrocortisone  25 MG suppository Commonly known as: ANUSOL -HC Place 1 suppository (25 mg total) rectally 2 (two) times daily.   montelukast  10 MG tablet Commonly known as: SINGULAIR  TAKE 1 TABLET BY MOUTH AT BEDTIME   valACYclovir 1000 MG tablet Commonly known as: VALTREX Take 1,000 mg by mouth 3 (three) times daily.

## 2023-10-25 ENCOUNTER — Ambulatory Visit (INDEPENDENT_AMBULATORY_CARE_PROVIDER_SITE_OTHER): Admitting: Family Medicine

## 2023-10-25 ENCOUNTER — Encounter: Payer: Self-pay | Admitting: Family Medicine

## 2023-10-25 VITALS — BP 130/74 | HR 107 | Temp 97.5°F | Ht 66.75 in | Wt 193.4 lb

## 2023-10-25 DIAGNOSIS — J3089 Other allergic rhinitis: Secondary | ICD-10-CM

## 2023-10-25 DIAGNOSIS — J302 Other seasonal allergic rhinitis: Secondary | ICD-10-CM

## 2023-10-25 DIAGNOSIS — J4540 Moderate persistent asthma, uncomplicated: Secondary | ICD-10-CM | POA: Diagnosis not present

## 2023-10-25 DIAGNOSIS — Z0000 Encounter for general adult medical examination without abnormal findings: Secondary | ICD-10-CM | POA: Diagnosis not present

## 2023-10-25 LAB — BASIC METABOLIC PANEL WITH GFR
BUN: 13 mg/dL (ref 6–23)
CO2: 29 meq/L (ref 19–32)
Calcium: 9.6 mg/dL (ref 8.4–10.5)
Chloride: 101 meq/L (ref 96–112)
Creatinine, Ser: 1.08 mg/dL (ref 0.40–1.50)
GFR: 88.09 mL/min (ref >60.00)
Glucose, Bld: 107 mg/dL — ABNORMAL HIGH (ref 70–99)
Potassium: 4 meq/L (ref 3.5–5.1)
Sodium: 138 meq/L (ref 135–145)

## 2023-10-25 LAB — CBC WITH DIFFERENTIAL/PLATELET
Basophils Absolute: 0.1 10*3/uL (ref 0.0–0.1)
Basophils Relative: 0.8 % (ref 0.0–3.0)
Eosinophils Absolute: 0.3 10*3/uL (ref 0.0–0.7)
Eosinophils Relative: 3.3 % (ref 0.0–5.0)
HCT: 44.4 % (ref 39.0–52.0)
Hemoglobin: 14.7 g/dL (ref 13.0–17.0)
Lymphocytes Relative: 14.7 % (ref 12.0–46.0)
Lymphs Abs: 1.5 10*3/uL (ref 0.7–4.0)
MCHC: 33 g/dL (ref 30.0–36.0)
MCV: 84.7 fl (ref 78.0–100.0)
Monocytes Absolute: 0.9 10*3/uL (ref 0.1–1.0)
Monocytes Relative: 9.2 % (ref 3.0–12.0)
Neutro Abs: 7.2 10*3/uL (ref 1.4–7.7)
Neutrophils Relative %: 72 % (ref 43.0–77.0)
Platelets: 279 10*3/uL (ref 150.0–400.0)
RBC: 5.24 Mil/uL (ref 4.22–5.81)
RDW: 14.1 % (ref 11.5–15.5)
WBC: 10 10*3/uL (ref 4.0–10.5)

## 2023-10-25 LAB — HEPATIC FUNCTION PANEL
ALT: 20 U/L (ref 0–53)
AST: 17 U/L (ref 0–37)
Albumin: 4.6 g/dL (ref 3.5–5.2)
Alkaline Phosphatase: 79 U/L (ref 39–117)
Bilirubin, Direct: 0.3 mg/dL (ref 0.0–0.3)
Total Bilirubin: 2 mg/dL — ABNORMAL HIGH (ref 0.2–1.2)
Total Protein: 7 g/dL (ref 6.0–8.3)

## 2023-10-25 LAB — LIPID PANEL
Cholesterol: 205 mg/dL — ABNORMAL HIGH (ref 0–200)
HDL: 37.5 mg/dL — ABNORMAL LOW (ref >39.00)
LDL Cholesterol: 143 mg/dL — ABNORMAL HIGH (ref 0–99)
NonHDL: 167.44
Total CHOL/HDL Ratio: 5
Triglycerides: 124 mg/dL (ref 0.0–149.0)
VLDL: 24.8 mg/dL (ref 0.0–40.0)

## 2023-10-25 LAB — HEMOGLOBIN A1C: Hgb A1c MFr Bld: 5.6 % (ref 4.6–6.5)

## 2023-10-25 NOTE — Patient Instructions (Signed)
 [facility]  in [place] [phone] (phone)

## 2023-10-26 ENCOUNTER — Encounter: Payer: Self-pay | Admitting: Family Medicine

## 2023-12-05 ENCOUNTER — Other Ambulatory Visit: Payer: Self-pay | Admitting: Family Medicine

## 2023-12-12 ENCOUNTER — Other Ambulatory Visit: Payer: Self-pay | Admitting: Family Medicine

## 2023-12-28 ENCOUNTER — Other Ambulatory Visit: Payer: Self-pay | Admitting: Family Medicine

## 2024-01-15 ENCOUNTER — Other Ambulatory Visit: Payer: Self-pay | Admitting: Family Medicine
